# Patient Record
Sex: Female | Born: 1990
Health system: Southern US, Community
[De-identification: ages and names within clinical notes are randomized; demographics above are authoritative.]

## PROBLEM LIST (undated history)

## (undated) ENCOUNTER — Inpatient Hospital Stay (HOSPITAL_COMMUNITY): Payer: Self-pay

## (undated) DIAGNOSIS — Z789 Other specified health status: Secondary | ICD-10-CM

## (undated) DIAGNOSIS — A749 Chlamydial infection, unspecified: Secondary | ICD-10-CM

## (undated) HISTORY — PX: DILATION AND CURETTAGE OF UTERUS: SHX78

## (undated) HISTORY — DX: Other specified health status: Z78.9

---

## 2015-10-18 ENCOUNTER — Ambulatory Visit (INDEPENDENT_AMBULATORY_CARE_PROVIDER_SITE_OTHER): Payer: 59 | Admitting: Family Medicine

## 2015-10-18 ENCOUNTER — Encounter: Payer: Self-pay | Admitting: Family Medicine

## 2015-10-18 VITALS — BP 118/90 | HR 79 | Temp 98.3°F | Ht 65.0 in | Wt 260.2 lb

## 2015-10-18 DIAGNOSIS — L0591 Pilonidal cyst without abscess: Secondary | ICD-10-CM

## 2015-10-18 MED ORDER — DOXYCYCLINE HYCLATE 100 MG PO CAPS: 100.0000 mg | ORAL_CAPSULE | Freq: Two times a day (BID) | ORAL | Status: DC

## 2015-10-18 NOTE — Patient Instructions (Signed)
Do warm, soapy water soaks twice a day for the next new days.  Then pat the area dry and apply a bandage or tuck some gauze as needed Use the doxycycline antibiotic as directed. If you do not get your period as expected this week take a home pregnancy test!   Let me know if the area does not continue to heal

## 2015-10-18 NOTE — Progress Notes (Signed)
Pre visit review using our clinic review tool, if applicable. No additional management support is needed unless otherwise documented below in the visit note. 

## 2015-10-18 NOTE — Progress Notes (Signed)
[facility] [place] [address], [address] [place], [place] [id number] [phone] [phone]  Date:  10/18/2015   Name:  Dawn FoldsCristal Mullins   DOB:  07/29/1990   MRN:  [id number]  PCP:  No primary care provider on file.    Chief Complaint: New Patient (Initial Visit)   History of Present Illness:  Dawn Mullins is a 25 y.o. very pleasant female patient who presents with the following: New patient for acute visit Generally healthy young lady here today with complaint of a painful cyst or abscess in her gluteal cleft that appeared about 4 days ago, became terribly painful and then ruptured on its own this am early.  It drained blood and liquid. It does feel a lot better now- she is able to walk and sit without pain.   She has never had this in the past. She did have a fever the last 2 days- up to 102.3.  She is feeling much better now and her fever is resolved.  No antipyretics today so far No vomiting She is eating pretty well- better today  LMP 3/15.   She is otherwise well today  There are no active problems to display for this patient.   No past medical history on file.  No past surgical history on file.  Social History  Substance Use Topics  . Smoking status: Never Smoker   . Smokeless tobacco: None  . Alcohol Use: None    No family history on file.  No Known Allergies  Medication list has been reviewed and updated.  No current outpatient prescriptions on file prior to visit.   No current facility-administered medications on file prior to visit.    Review of Systems:  As per HPI- otherwise negative.   Physical Examination: Filed Vitals:   10/18/15 1434  BP: 118/90  Pulse: 79  Temp: 98.3 F (36.8 C)   Filed Vitals:   10/18/15 1434  Height: 5\' 5"  (1.651 m)  Weight: 260 lb 3.2 oz (118.026 kg)   Body mass index is 43.3 kg/(m^2). Ideal Body Weight: Weight in (lb) to have BMI = 25: 149.9  GEN: WDWN, NAD, Non-toxic, A  & O x 3, obese, looks well HEENT: Atraumatic, Normocephalic. Neck supple. No masses, No LAD. Ears and Nose: No external deformity. CV: RRR, No M/G/R. No JVD. No thrill. No extra heart sounds. PULM: CTA B, no wheezes, crackles, rhonchi. No retractions. No resp. distress. No accessory muscle use. EXTR: No c/c/e NEURO Normal gait.  PSYCH: Normally interactive. Conversant. Not depressed or anxious appearing.  Calm demeanor.  Gluteal cleft- spontaneously ruptured infected pilonidal cyst is present. I can expressed serosanguinous fluid and the area is inflamed and tender but no pus is present  She is married- LMP less than a month ago. She and her husband use condoms consistently and she does not have any suspicion of pregnancy   Assessment and Plan: Pilonidal cyst - Plan: doxycycline (VIBRAMYCIN) 100 MG capsule  Already ruptured spontaneously. Use hot water soaks, doxycycline.  Cautioned against pregnancy while on this medication She will let me know if not continuing to improve Warned her to seek care right away if she has further infections with fever as this could lead to sepsis.     Signed Abbe AmsterdamJessica Rjay Revolorio, MD

## 2016-08-23 DIAGNOSIS — N926 Irregular menstruation, unspecified: Secondary | ICD-10-CM | POA: Diagnosis not present

## 2016-09-02 ENCOUNTER — Inpatient Hospital Stay (HOSPITAL_COMMUNITY): Payer: 59

## 2016-09-02 ENCOUNTER — Encounter (HOSPITAL_COMMUNITY): Payer: Self-pay

## 2016-09-02 ENCOUNTER — Inpatient Hospital Stay (HOSPITAL_COMMUNITY)
Admission: AD | Admit: 2016-09-02 | Discharge: 2016-09-02 | Disposition: A | Discharge status: Home / Self Care | Payer: 59 | Source: Ambulatory Visit | Attending: Obstetrics | Admitting: Obstetrics

## 2016-09-02 DIAGNOSIS — O209 Hemorrhage in early pregnancy, unspecified: Secondary | ICD-10-CM | POA: Diagnosis not present

## 2016-09-02 DIAGNOSIS — N930 Postcoital and contact bleeding: Secondary | ICD-10-CM | POA: Insufficient documentation

## 2016-09-02 DIAGNOSIS — Z3A08 8 weeks gestation of pregnancy: Secondary | ICD-10-CM | POA: Insufficient documentation

## 2016-09-02 DIAGNOSIS — O208 Other hemorrhage in early pregnancy: Secondary | ICD-10-CM | POA: Diagnosis not present

## 2016-09-02 HISTORY — DX: Chlamydial infection, unspecified: A74.9

## 2016-09-02 LAB — CBC WITH DIFFERENTIAL/PLATELET
BASOS PCT: 0 %
Basophils Absolute: 0 10*3/uL (ref 0.0–0.1)
EOS ABS: 0.1 10*3/uL (ref 0.0–0.7)
EOS PCT: 1 %
HEMATOCRIT: 36.9 % (ref 36.0–46.0)
Hemoglobin: 13 g/dL (ref 12.0–15.0)
Lymphocytes Relative: 20 %
Lymphs Abs: 2.5 10*3/uL (ref 0.7–4.0)
MCH: 28.2 pg (ref 26.0–34.0)
MCHC: 35.2 g/dL (ref 30.0–36.0)
MCV: 80 fL (ref 78.0–100.0)
MONO ABS: 0.5 10*3/uL (ref 0.1–1.0)
MONOS PCT: 4 %
Neutro Abs: 9.6 10*3/uL — ABNORMAL HIGH (ref 1.7–7.7)
Neutrophils Relative %: 75 %
Platelets: 309 10*3/uL (ref 150–400)
RBC: 4.61 MIL/uL (ref 3.87–5.11)
RDW: 13.2 % (ref 11.5–15.5)
WBC: 12.7 10*3/uL — ABNORMAL HIGH (ref 4.0–10.5)

## 2016-09-02 LAB — URINALYSIS, ROUTINE W REFLEX MICROSCOPIC
Bilirubin Urine: NEGATIVE
GLUCOSE, UA: NEGATIVE mg/dL
Ketones, ur: NEGATIVE mg/dL
Leukocytes, UA: NEGATIVE
NITRITE: NEGATIVE
PH: 5 (ref 5.0–8.0)
Protein, ur: NEGATIVE mg/dL
Specific Gravity, Urine: 1.021 (ref 1.005–1.030)

## 2016-09-02 LAB — HCG, QUANTITATIVE, PREGNANCY: HCG, BETA CHAIN, QUANT, S: 57409 m[IU]/mL — AB (ref <5)

## 2016-09-02 LAB — WET PREP, GENITAL
Sperm: NONE SEEN
TRICH WET PREP: NONE SEEN
YEAST WET PREP: NONE SEEN

## 2016-09-02 LAB — ABO/RH: ABO/RH(D): B POS

## 2016-09-02 NOTE — Discharge Instructions (Signed)
Vaginal Bleeding During Pregnancy, First Trimester °A small amount of bleeding (spotting) from the vagina is common in early pregnancy. Sometimes the bleeding is normal and is not a problem, and sometimes it is a sign of something serious. Be sure to tell your doctor about any bleeding from your vagina right away. °Follow these instructions at home: °· Watch your condition for any changes. °· Follow your doctor's instructions about how active you can be. °· If you are on bed rest: °¨ You may need to stay in bed and only get up to use the bathroom. °¨ You may be allowed to do some activities. °¨ If you need help, make plans for someone to help you. °· Write down: °¨ The number of pads you use each day. °¨ How often you change pads. °¨ How soaked (saturated) your pads are. °· Do not use tampons. °· Do not douche. °· Do not have sex or orgasms until your doctor says it is okay. °· If you pass any tissue from your vagina, save the tissue so you can show it to your doctor. °· Only take medicines as told by your doctor. °· Do not take aspirin because it can make you bleed. °· Keep all follow-up visits as told by your doctor. °Contact a doctor if: °· You bleed from your vagina. °· You have cramps. °· You have labor pains. °· You have a fever that does not go away after you take medicine. °Get help right away if: °· You have very bad cramps in your back or belly (abdomen). °· You pass large clots or tissue from your vagina. °· You bleed more. °· You feel light-headed or weak. °· You pass out (faint). °· You have chills. °· You are leaking fluid or have a gush of fluid from your vagina. °· You pass out while pooping (having a bowel movement). °This information is not intended to replace advice given to you by your health care provider. Make sure you discuss any questions you have with your health care provider. °Document Released: 11/10/2013 Document Revised: 12/02/2015 Document Reviewed: 03/03/2013 °Elsevier Interactive  Patient Education © 2017 Elsevier Inc. ° °

## 2016-09-02 NOTE — MAU Provider Note (Signed)
History   G2P0010 @ 8.6 wks in with brownish spotting since yesterday. States started after having sex yesterday. Pt states [facility] and had viable IUP at that time. Denies pain or cramping.  CSN: [id number]  Arrival date & time 09/02/16  1740   None     Chief Complaint  Patient presents with  . Vaginal Bleeding    HPI  Past Medical History:  Diagnosis Date  . Chlamydia     Past Surgical History:  Procedure Laterality Date  . DILATION AND CURETTAGE OF UTERUS      History reviewed. No pertinent family history.  Social History  Substance Use Topics  . Smoking status: Never Smoker  . Smokeless tobacco: Never Used  . Alcohol use No    OB History    Gravida Para Term Preterm AB Living   2       1     SAB TAB Ectopic Multiple Live Births     1            Review of Systems  Constitutional: Negative.   HENT: Negative.   Eyes: Negative.   Respiratory: Negative.   Cardiovascular: Negative.   Gastrointestinal: Negative.   Endocrine: Negative.   Genitourinary: Positive for vaginal bleeding.  Musculoskeletal: Negative.     Allergies  Patient has no known allergies.  Home Medications    BP 144/69   Pulse 79   Temp 98.8 F (37.1 C)   Resp 16   Ht 5\' 5"  (1.651 m)   Wt 258 lb (117 kg)   LMP 07/02/2016   BMI 42.93 kg/m   Physical Exam  Constitutional: She is oriented to person, place, and time. She appears well-developed and well-nourished.  HENT:  Head: Normocephalic.  Eyes: Pupils are equal, round, and reactive to light.  Neck: Normal range of motion.  Cardiovascular: Normal rate, regular rhythm, normal heart sounds and intact distal pulses.   Pulmonary/Chest: Effort normal and breath sounds normal.  Abdominal: Soft. Bowel sounds are normal.  Genitourinary: Vagina normal and uterus normal.  Musculoskeletal: Normal range of motion.  Neurological: She is alert and oriented to person, place, and time. She has normal reflexes.  Skin:  Skin is warm and dry.  Psychiatric: She has a normal mood and affect. Her behavior is normal. Judgment and thought content normal.    MAU Course  Procedures (including critical care time)  Labs Reviewed  WET PREP, GENITAL  URINALYSIS, ROUTINE W REFLEX MICROSCOPIC  CBC WITH DIFFERENTIAL/PLATELET  HCG, QUANTITATIVE, PREGNANCY  ABO/RH  GC/CHLAMYDIA PROBE AMP ([place]) NOT AT [facility]   No results found.   No diagnosis found.    MDM  Post coital bleeding. Sterile spec exam cervix closed only spottingamt brownish old blood in vaginal vault no active bleeding. Wet prep, GC, Chla, ABO rh and quant

## 2016-09-02 NOTE — MAU Note (Signed)
Pt had light bleeding today when she wiped. Not filling a pad. Vomiting no diarrhea. No pain

## 2016-09-04 LAB — GC/CHLAMYDIA PROBE AMP (~~LOC~~) NOT AT ARMC
Chlamydia: NEGATIVE
NEISSERIA GONORRHEA: NEGATIVE

## 2016-09-24 ENCOUNTER — Encounter (HOSPITAL_COMMUNITY): Payer: Self-pay | Admitting: *Deleted

## 2016-09-24 ENCOUNTER — Inpatient Hospital Stay (HOSPITAL_COMMUNITY)
Admission: RE | Admit: 2016-09-24 | Discharge: 2016-09-24 | Disposition: A | Discharge status: Home / Self Care | Payer: 59 | Source: Ambulatory Visit | Attending: Obstetrics and Gynecology | Admitting: Obstetrics and Gynecology

## 2016-09-24 ENCOUNTER — Inpatient Hospital Stay (HOSPITAL_COMMUNITY): Payer: 59

## 2016-09-24 DIAGNOSIS — O98811 Other maternal infectious and parasitic diseases complicating pregnancy, first trimester: Secondary | ICD-10-CM | POA: Insufficient documentation

## 2016-09-24 DIAGNOSIS — O209 Hemorrhage in early pregnancy, unspecified: Secondary | ICD-10-CM | POA: Diagnosis present

## 2016-09-24 DIAGNOSIS — A749 Chlamydial infection, unspecified: Secondary | ICD-10-CM | POA: Insufficient documentation

## 2016-09-24 DIAGNOSIS — O4690 Antepartum hemorrhage, unspecified, unspecified trimester: Secondary | ICD-10-CM

## 2016-09-24 DIAGNOSIS — Z3A08 8 weeks gestation of pregnancy: Secondary | ICD-10-CM | POA: Insufficient documentation

## 2016-09-24 DIAGNOSIS — O039 Complete or unspecified spontaneous abortion without complication: Secondary | ICD-10-CM | POA: Insufficient documentation

## 2016-09-24 DIAGNOSIS — R10814 Left lower quadrant abdominal tenderness: Secondary | ICD-10-CM | POA: Diagnosis not present

## 2016-09-24 NOTE — Discharge Instructions (Signed)
° °  Miscarriage A miscarriage is the loss of an unborn baby (fetus) before the 20th week of pregnancy. The cause is often unknown. Follow these instructions at home:  You may need to stay in bed (bed rest), or you may be able to do light activity. Go about activity as told by your doctor.  Have help at home.  Write down how many pads you use each day. Write down how soaked they are.  Do not use tampons. Do not wash out your vagina (douche) or have sex (intercourse) until your doctor approves.  Only take medicine as told by your doctor.  Do not take aspirin.  Keep all doctor visits as told.  If you or your partner have problems with grieving, talk to your doctor. You can also try counseling. Give yourself time to grieve before trying to get pregnant again. Get help right away if:  You have bad cramps or pain in your back or belly (abdomen).  You have a fever.  You pass large clumps of blood (clots) from your vagina that are walnut-sized or larger. Save the clumps for your doctor to see.  You pass large amounts of tissue from your vagina. Save the tissue for your doctor to see.  You have more bleeding.  You have thick, bad-smelling fluid (discharge) coming from the vagina.  You get lightheaded, weak, or you pass out (faint).  You have chills. This information is not intended to replace advice given to you by your health care provider. Make sure you discuss any questions you have with your health care provider. Document Released: 09/18/2011 Document Revised: 12/02/2015 Document Reviewed: 07/27/2011 Elsevier Interactive Patient Education  2017 Elsevier Inc.   Pelvic Rest Pelvic rest may be recommended if:  Your placenta is partially or completely covering the opening of your cervix (placenta previa).  There is bleeding between the wall of the uterus and the amniotic sac in the first trimester of pregnancy (subchorionic hemorrhage).  You went into labor too early (preterm  labor). Based on your overall health and the health of your baby, your health care provider will decide if pelvic rest is right for you. How do I rest my pelvis? For as long as told by your health care provider:  Do not have sex, sexual stimulation, or an orgasm.  Do not use tampons. Do not douche. Do not put anything in your vagina.  Do not lift anything that is heavier than 10 lb (4.5 kg).  Avoid activities that take a lot of effort (are strenuous).  Avoid any activity in which your pelvic muscles could become strained. When should I seek medical care? Seek medical care if you have:  Cramping pain in your lower abdomen.  Vaginal discharge.  A low, dull backache.  Regular contractions.  Uterine tightening. When should I seek immediate medical care? Seek immediate medical care if:  You have vaginal bleeding and you are pregnant. This information is not intended to replace advice given to you by your health care provider. Make sure you discuss any questions you have with your health care provider. Document Released: 10/21/2010 Document Revised: 12/02/2015 Document Reviewed: 12/28/2014 Elsevier Interactive Patient Education  2017 Elsevier Inc. CALL  IF TEMP>100.4, NOTHING PER VAGINA X 2 WK, CALL IF SOAKING A MAXI  PAD EVERY HOUR OR MORE FREQUENTLY

## 2016-09-24 NOTE — MAU Note (Addendum)
[facility]  [place]. Pt has had US in office and here, confirmed IUP. Mod amt of blood noted. Pt denies pain.  Needed to use restroom on arrival. Pt denies dizziness.  IV of NS infusing, 400 to count.

## 2016-09-24 NOTE — MAU Provider Note (Signed)
History     Chief Complaint  Patient presents with  . bleeding in preg    26 yo G2P0010 Hispanic female presents with c/o vaginal bleeding and passage of clots. LMP 2/27 hx irreg cycles.  Pt is currently [redacted] weeks pregnant and had sonogram in the office which had shown SIUP  With (+) FHR with date discrepancy  OB History    Gravida Para Term Preterm AB Living   2       1     SAB TAB Ectopic Multiple Live Births     1            Past Medical History:  Diagnosis Date  . Chlamydia     Past Surgical History:  Procedure Laterality Date  . DILATION AND CURETTAGE OF UTERUS      History reviewed. No pertinent family history.  Social History  Substance Use Topics  . Smoking status: Never Smoker  . Smokeless tobacco: Never Used  . Alcohol use No    Allergies: No Known Allergies  No prescriptions prior to admission.     Physical Exam   Blood pressure 123/66, pulse 76, temperature 98.4 F (36.9 C), temperature source Oral, resp. rate 16, last menstrual period 07/02/2016, SpO2 100 %.  General appearance: alert, cooperative and no distress Lungs: clear to auscultation bilaterally Heart: regular rate and rhythm Abdomen: soft, non-tender; bowel sounds normal; no masses,  no organomegaly Pelvic: cervix normal in appearance, external genitalia normal, no adnexal masses or tenderness, uterus normal size, shape, and consistency and closed cervix, uterus nl, blood in vagina Extremities: no edema, redness or tenderness in the calves or thighs ED Course  IMP: 1st trimester vaginal bleeding P) sonogram MDM  [place] Ob Transvaginal  Result Date: 09/24/2016 CLINICAL DATA:  Vaginal bleeding. Estimated gestational age [redacted] weeks 6 days by previous ultrasound. EXAM: TRANSVAGINAL OB ULTRASOUND TECHNIQUE: Transvaginal ultrasound was performed for complete evaluation of the gestation as well as the maternal uterus, adnexal regions, and pelvic cul-de-sac. COMPARISON:  09/02/2016 FINDINGS:  Intrauterine gestational sac: Single visualized. Gestational sac is somewhat elongated and irregular compared to the previous exam. Location of sac in lower uterine segment. Yolk sac:  Visualized. Embryo:  Visualized. Cardiac Activity: Not visualized. Heart Rate: Absent. CRL:   4.6  mm   6 w 1 d                  US EDC: 05/17/2017 Subchorionic hemorrhage:  None visualized. Maternal uterus/adnexae: Ovaries not visualized.  No free fluid. IMPRESSION: When compared with the previous exam, findings are compatible with failed pregnancy. Electronically Signed   By: Elberta Fortisaniel  Boyle M.D.   On: 09/24/2016 16:03  IMP: MAB/inevitable SAB Pt declines cytotec p)d/c home. Heartstring info given. SAB precauations given. Rh positive. f/u with Dr Coralyn Pearaavon Minette Manders A, MD 3:30 PM 09/24/2016

## 2016-09-24 NOTE — MAU Note (Signed)
Patient states on Friday she had light pink vaginal bleeding that she noticed when she wiped; then today progressed to period like vaginal bleeding with clots. Denies any pain at this time.

## 2016-11-21 LAB — OB RESULTS CONSOLE GBS: STREP GROUP B AG: NEGATIVE

## 2017-01-30 ENCOUNTER — Encounter: Payer: Self-pay | Admitting: Medical

## 2017-01-30 ENCOUNTER — Ambulatory Visit (INDEPENDENT_AMBULATORY_CARE_PROVIDER_SITE_OTHER): Payer: 59 | Admitting: Medical

## 2017-01-30 DIAGNOSIS — J029 Acute pharyngitis, unspecified: Secondary | ICD-10-CM | POA: Diagnosis not present

## 2017-01-30 DIAGNOSIS — J020 Streptococcal pharyngitis: Secondary | ICD-10-CM | POA: Diagnosis not present

## 2017-01-30 LAB — POCT RAPID STREP A (OFFICE): Rapid Strep A Screen: POSITIVE — AB

## 2017-01-30 MED ORDER — AMOXICILLIN 875 MG PO TABS
875.0000 mg | ORAL_TABLET | Freq: Two times a day (BID) | ORAL | 0 refills | Status: DC
Start: 2017-01-30 — End: 2017-12-28

## 2017-01-30 MED ORDER — TRAMADOL HCL 50 MG PO TABS: 50.0000 mg | ORAL_TABLET | Freq: Four times a day (QID) | ORAL | 0 refills | Status: DC | PRN

## 2017-01-30 MED FILL — traMADol HCL 50 MG TABS: 50 | 1 days supply | Qty: 3 | Fill #0

## 2017-01-30 MED FILL — AMOXICILLIN 875 MG TABLET: 875 | 10 days supply | Qty: 20 | Fill #0

## 2017-01-30 NOTE — Progress Notes (Signed)
Subjective:    Patient ID: Dawn Mullins, female    DOB: [birth year], 26 y.o.   MRN: [id number]  HPI  Pt in with st for 2 days. Pt states severe st. Hurts to swallow own saliva. Hard to sleep due to pain. Pt has 38 yo nephew. Pt nephew was sick with staph infection. Also he was sick with severe runny nose/colored mucous. She has been watching him recently.  Pt states some mild body aches. No Ha. No rashes.  Some sweaty but no fever or chills.  LMP- started yesterday.     Review of Systems  Constitutional: Positive for diaphoresis. Negative for chills and fever.  HENT: Positive for sore throat. Negative for congestion, drooling and ear pain.   Respiratory: Negative for cough, chest tightness, shortness of breath and wheezing.   Cardiovascular: Negative for chest pain and palpitations.  Gastrointestinal: Negative for abdominal pain.  Musculoskeletal: Positive for myalgias. Negative for back pain and gait problem.       Mild  Skin: Negative for rash.  Neurological: Negative for dizziness, speech difficulty, weakness, numbness and headaches.  Hematological: Negative for adenopathy. Does not bruise/bleed easily.  Psychiatric/Behavioral: Negative for behavioral problems and confusion.    Past Medical History:  Diagnosis Date  . Chlamydia      Social History   Social History  . Marital status: Single    Spouse name: N/A  . Number of children: N/A  . Years of education: N/A   Occupational History  . Not on file.   Social History Main Topics  . Smoking status: Never Smoker  . Smokeless tobacco: Never Used  . Alcohol use No  . Drug use: No  . Sexual activity: Yes   Other Topics Concern  . Not on file   Social History Narrative  . No narrative on file    Past Surgical History:  Procedure Laterality Date  . DILATION AND CURETTAGE OF UTERUS      No family history on file.  No Known Allergies  No current outpatient prescriptions on file prior to visit.    No current facility-administered medications on file prior to visit.     LMP 07/02/2016       Objective:   Physical Exam   General  Mental Status - Alert. General Appearance - Well groomed. Not in acute distress.  Skin Rashes- No Rashes.  HEENT Head- Normal. Ear Auditory Canal - Left- Normal. Right - Normal.Tympanic Membrane- Left- Normal. Right- Normal. Eye Sclera/Conjunctiva- Left- Normal. Right- Normal. Nose & Sinuses Nasal Mucosa- Left-  Boggy and Congested. Right-  Boggy and  Congested.Bilateral no maxillary and  No frontal sinus pressure. Mouth & Throat Lips: Upper Lip- Normal: no dryness, cracking, pallor, cyanosis, or vesicular eruption. Lower Lip-Normal: no dryness, cracking, pallor, cyanosis or vesicular eruption. Buccal Mucosa- Bilateral- No Aphthous ulcers. Oropharynx- No Discharge or Erythema. Tonsils: Characteristics- Bilateral-moderate  Erythema or Congestion. Size/Enlargement- Bilateral- 1+ enlargement. Discharge- bilateral-None.  Neck Neck- Supple. No Masses. Mild enlarged submandibular lymph nodes.   Chest and Lung Exam Auscultation: Breath Sounds:-Clear even and unlabored.  Cardiovascular Auscultation:Rythm- Regular, rate and rhythm. Murmurs & Other Heart Sounds:Ausculatation of the heart reveal- No Murmurs.  Lymphatic Head & Neck General Head & Neck Lymphatics: Bilateral: Description- Mild enlarged submandibular lymph nodes.      Assessment & Plan:  Your strep test was positive. I am prescribing amoxicillin antibiotic. Rest hydrate, tylenol for fever and warm salt water gargles.   For severe pain will give 3 tabs  of tramadol. Within 24 hours expect throat pain to be much less.  Follow up in 7 days or as needed. Unable to sleep due to severe pain so decided to rx very few tramadol.

## 2017-01-30 NOTE — Patient Instructions (Signed)
Your strep test was positive. I am prescribing amoxicillin antibiotic. Rest hydrate, tylenol for fever and warm salt water gargles.   For severe pain will give 3 tabs of tramadol. Within 24 hours expect throat pain to be much less.  Follow up in 7 days or as needed.

## 2017-05-15 LAB — OB RESULTS CONSOLE ANTIBODY SCREEN: ANTIBODY SCREEN: NEGATIVE

## 2017-05-15 LAB — OB RESULTS CONSOLE RUBELLA ANTIBODY, IGM: Rubella: IMMUNE

## 2017-05-15 LAB — OB RESULTS CONSOLE ABO/RH: RH Type: POSITIVE

## 2017-05-15 LAB — OB RESULTS CONSOLE HIV ANTIBODY (ROUTINE TESTING): HIV: NONREACTIVE

## 2017-05-15 LAB — OB RESULTS CONSOLE RPR: RPR: NONREACTIVE

## 2017-05-15 LAB — OB RESULTS CONSOLE GC/CHLAMYDIA
CHLAMYDIA, DNA PROBE: NEGATIVE
GC PROBE AMP, GENITAL: NEGATIVE

## 2017-05-15 LAB — OB RESULTS CONSOLE HEPATITIS B SURFACE ANTIGEN: HEP B S AG: NEGATIVE

## 2017-06-14 IMAGING — US US OB COMP LESS 14 WK
1 series · 15 of 28 positions shown · non-contrast
Comparison: None.

CLINICAL DATA: Bleeding and vomiting

EXAM:
OBSTETRIC <14 WK US AND TRANSVAGINAL OB US
TECHNIQUE: Both transabdominal and transvaginal ultrasound examinations were
performed for complete evaluation of the gestation as well as the
maternal uterus, adnexal regions, and pelvic cul-de-sac.
Transvaginal technique was performed to assess early pregnancy.

[Series 1: us ob comp less 14 wk · 33 acquisitions, 15 frames shown]
[im 1/33]
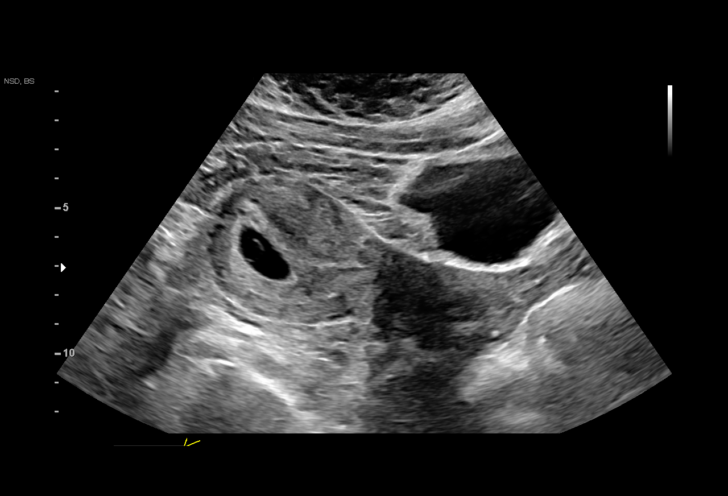
[im 3/33]
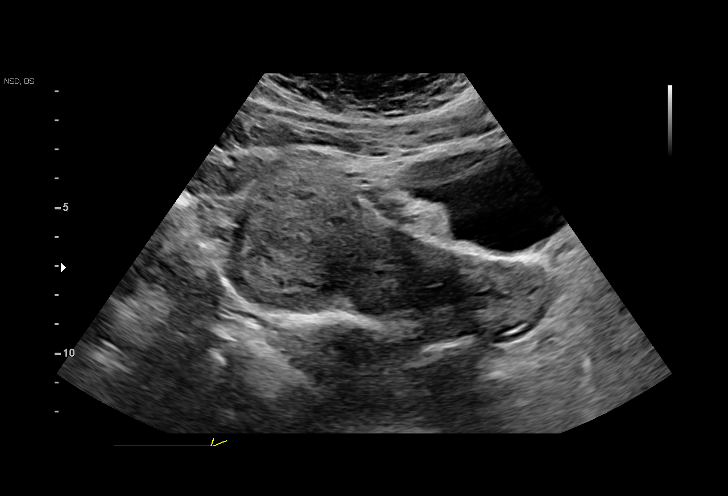
[im 5/33]
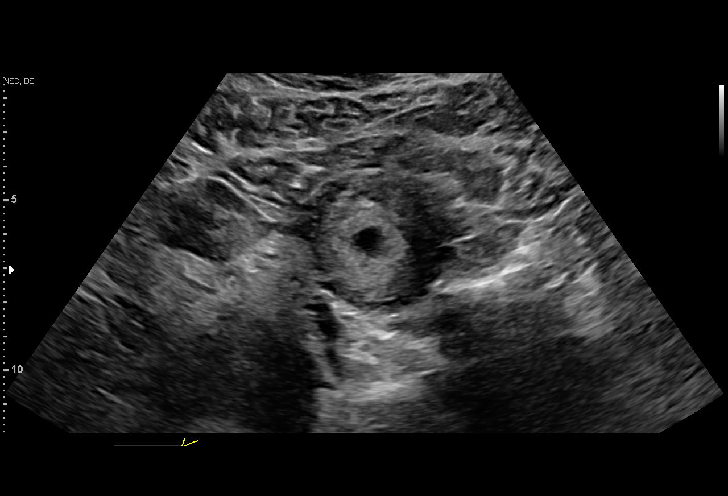
[im 8/33]
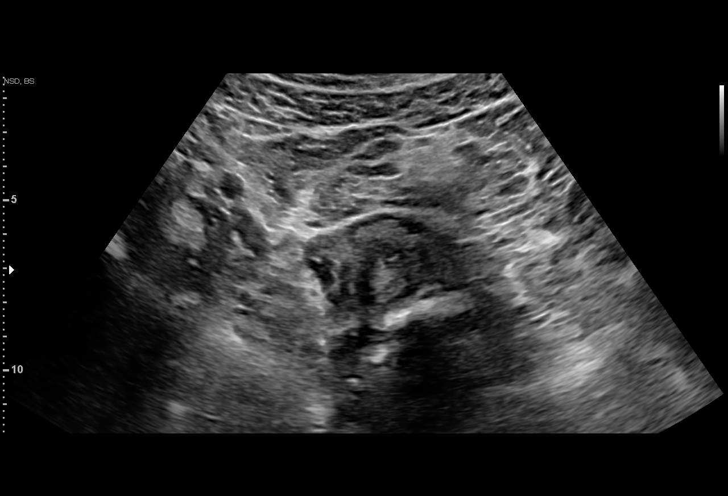
[im 10/33]
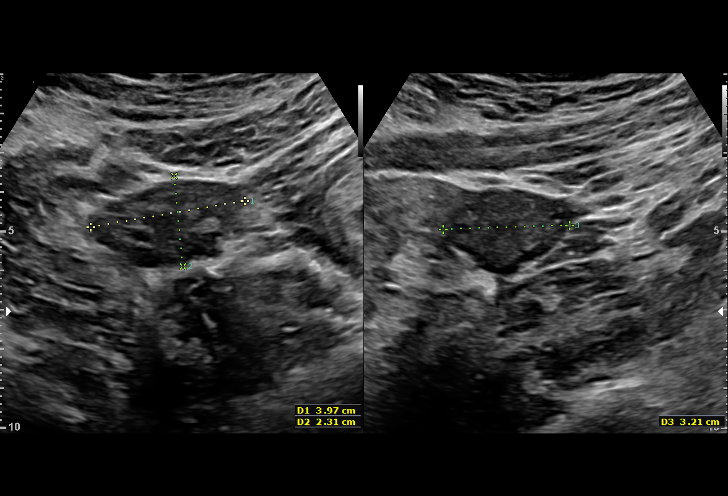
[im 12/33]
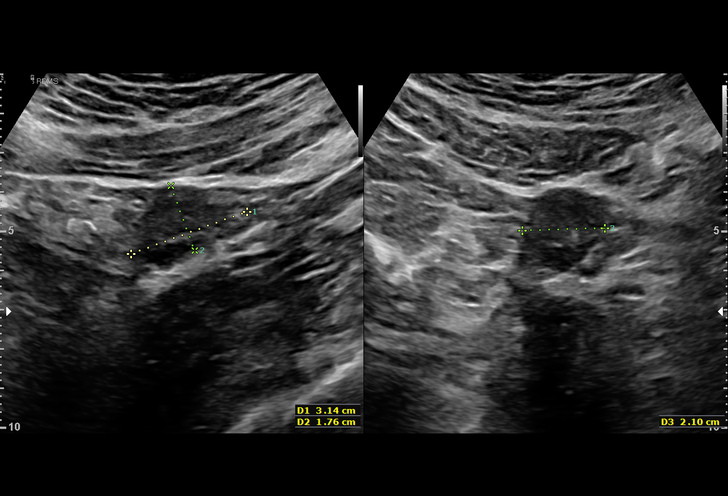
[im 15/33]
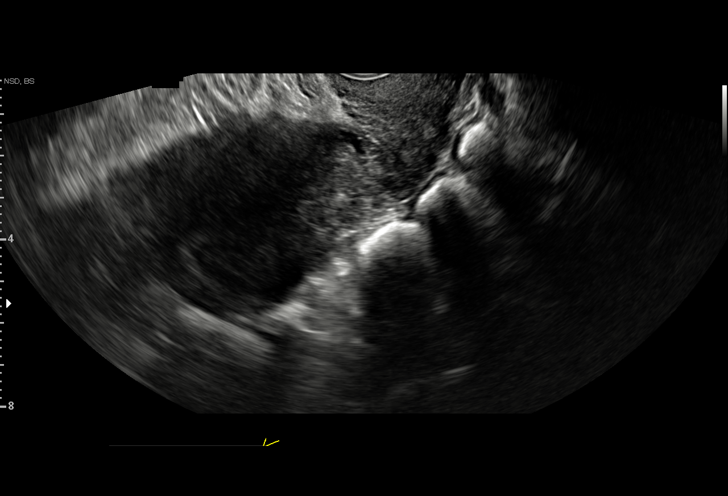
[im 17/33]
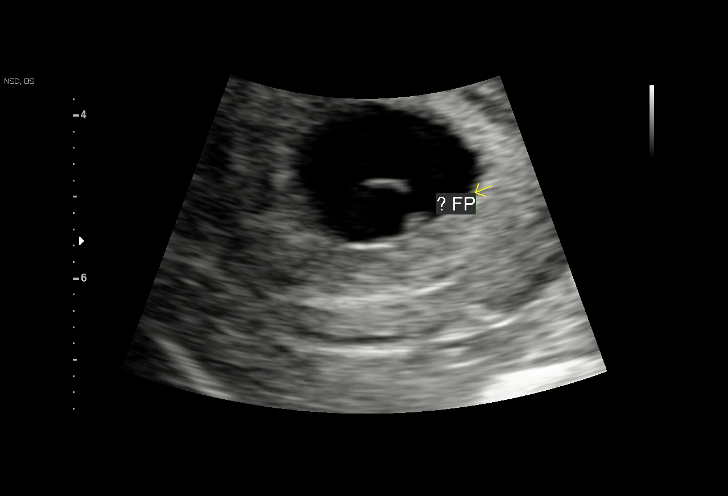
[im 18/33]
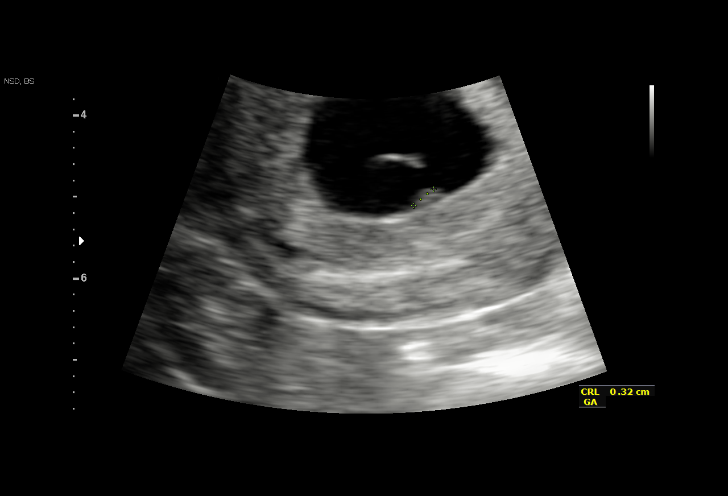
[im 21/33]
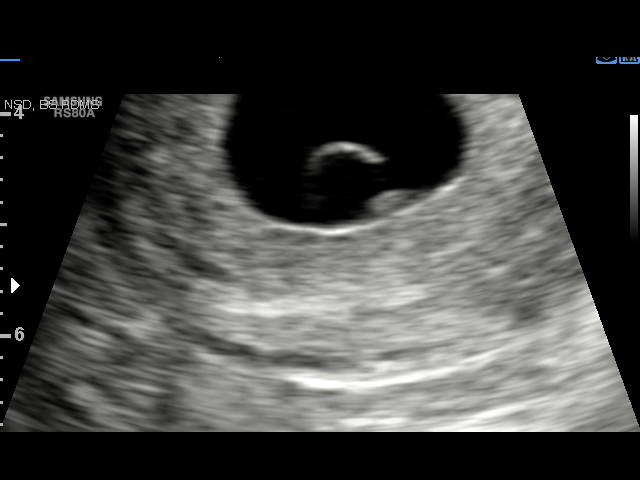
[im 23/33]
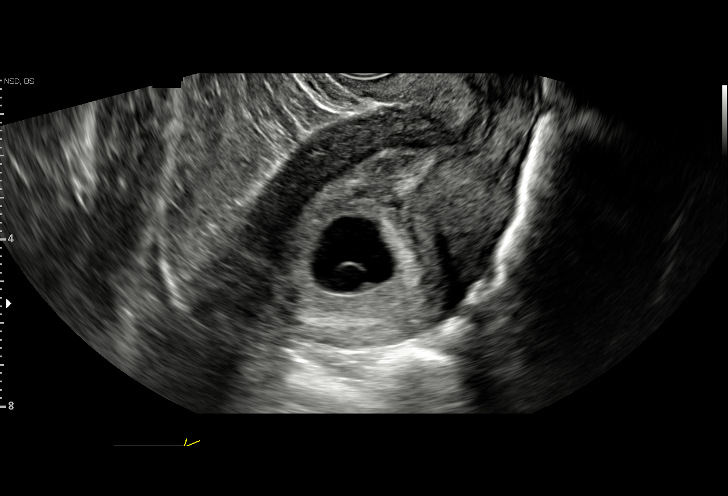
[im 25/33]
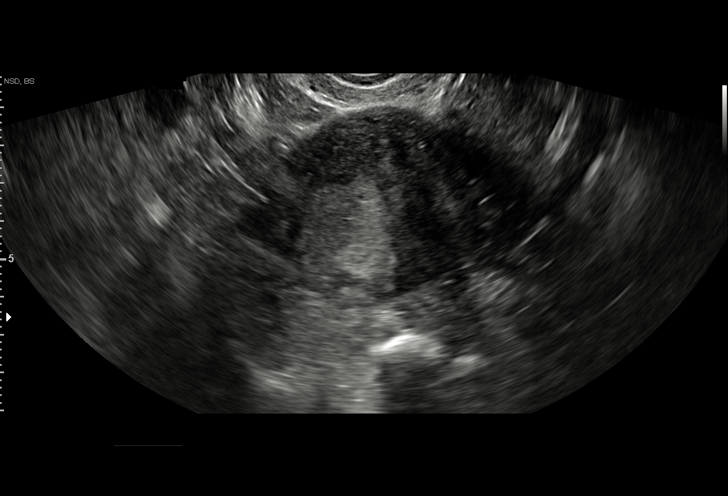
[im 28/33]
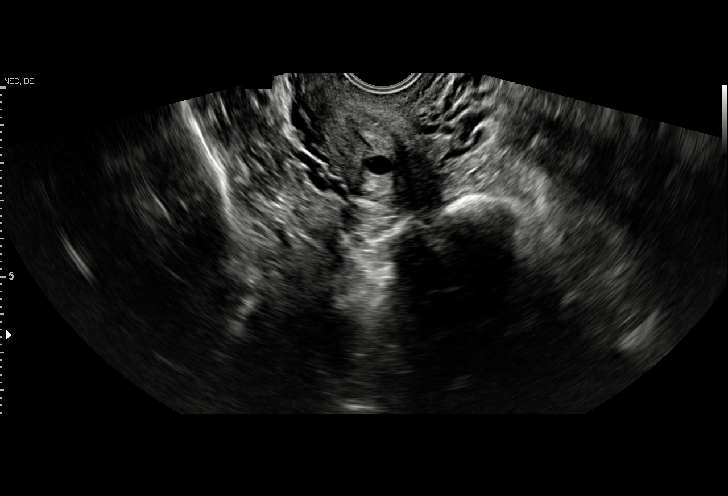
[im 30/33]
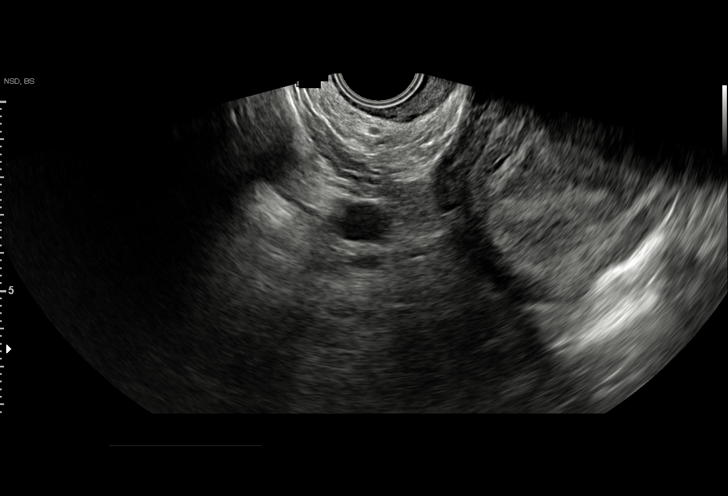
[im 33/33]
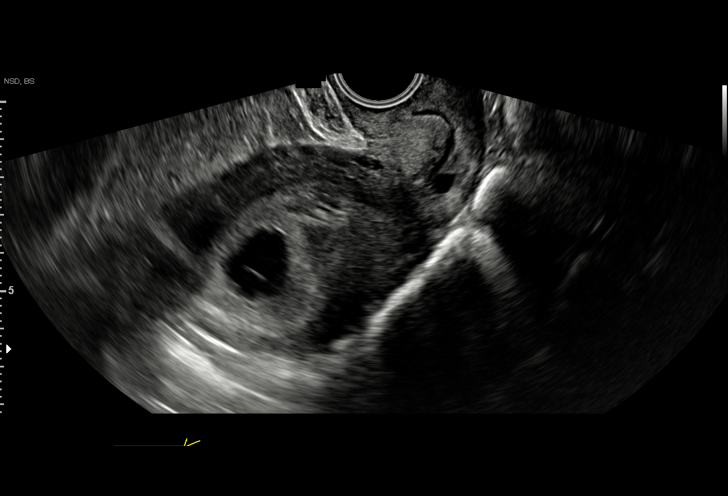

[15 of 28 positions shown; findings below may reference images not displayed]

FINDINGS: Intrauterine gestational sac: Single intrauterine gestational sac.

Yolk sac:  Visualized

Embryo:  Visualized

Cardiac Activity: Visualized

Heart Rate: 95  bpm

CRL:  2.8  mm   5 w   5 d                  US EDC: 04/30/2017

Subchorionic hemorrhage:  None visualized.

Maternal uterus/adnexae: Bilateral ovaries are within normal limits.
The left ovary measures 3.1 x 1.8 x 2.1 cm. The right ovary measures
4 x 2.3 x 3.2 cm. No free fluid.
IMPRESSION: Single intrauterine gestation with tiny fetal pole as above. Fetal
cardiac activity of 95 beats per minute.

## 2017-07-06 IMAGING — US US OB TRANSVAGINAL
1 series · 15 of 26 positions shown · non-contrast
Comparison: 09/02/2016

CLINICAL DATA: Vaginal bleeding. Estimated gestational age 8 weeks
6 days by previous ultrasound.

EXAM:
TRANSVAGINAL OB ULTRASOUND
TECHNIQUE: Transvaginal ultrasound was performed for complete evaluation of the
gestation as well as the maternal uterus, adnexal regions, and
pelvic cul-de-sac.

[Series 1: us ob transvaginal · 26 acquisitions, 15 frames shown]
[im 1/26]
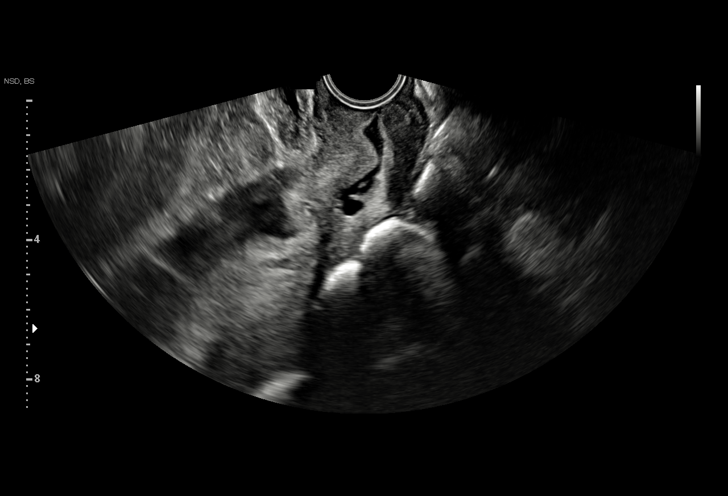
[im 3/26]
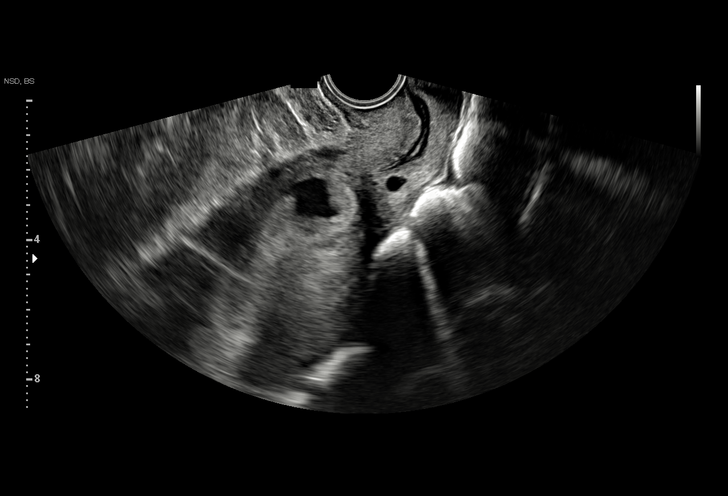
[im 5/26]
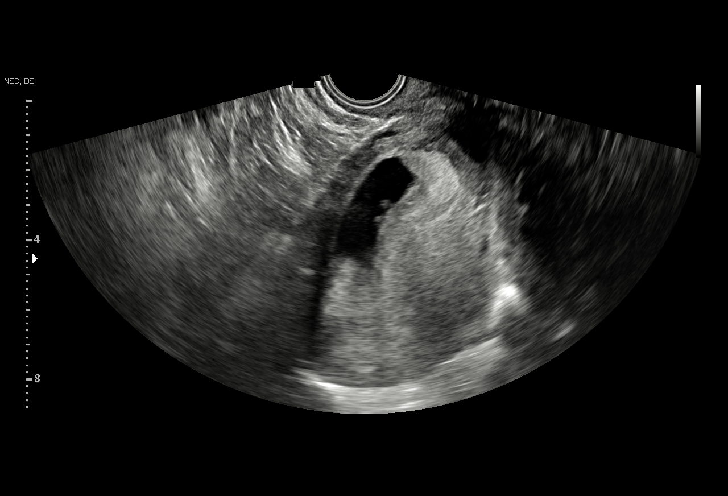
[im 7/26]
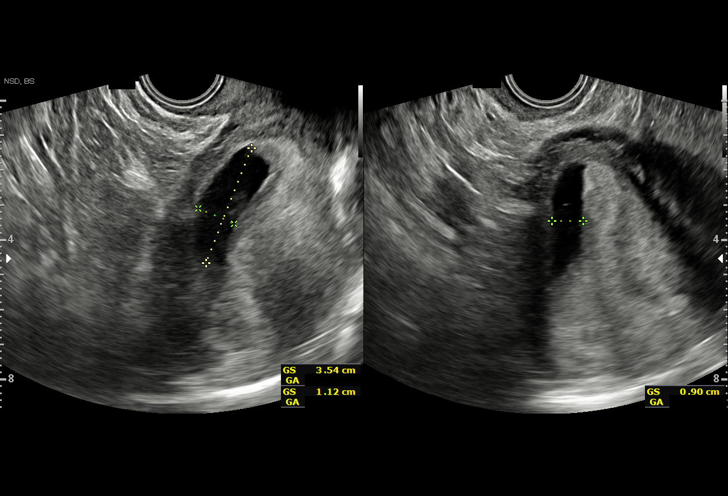
[im 8/26]
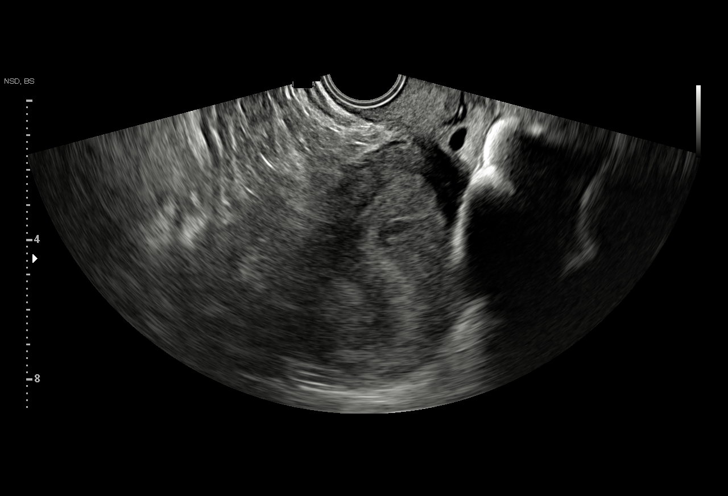
[im 10/26]
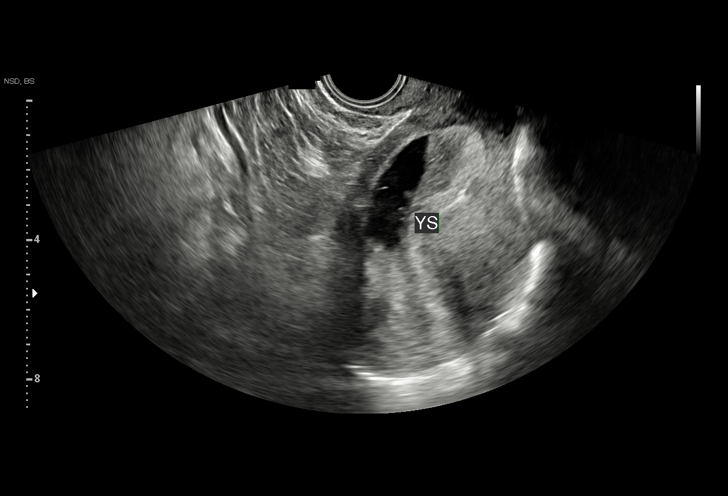
[im 12/26]
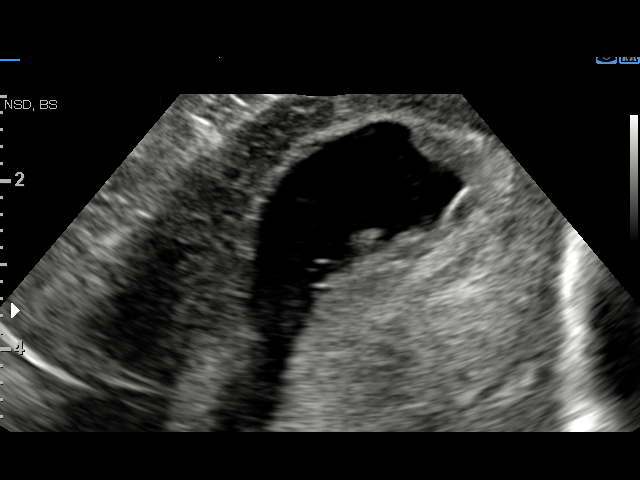
[im 14/26]
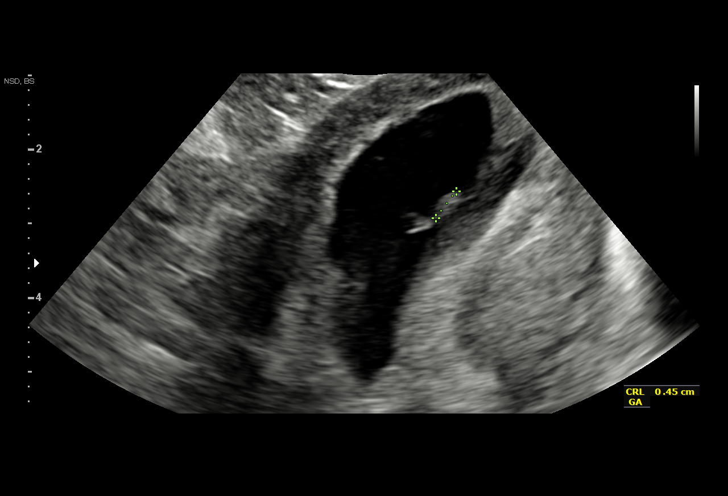
[im 15/26]
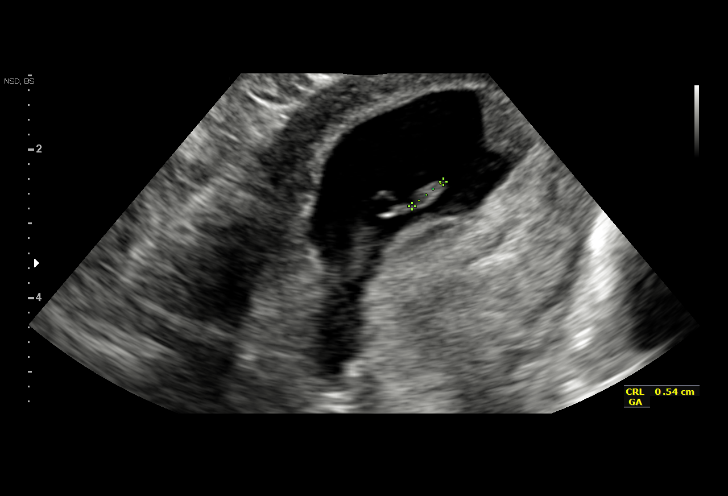
[im 17/26]
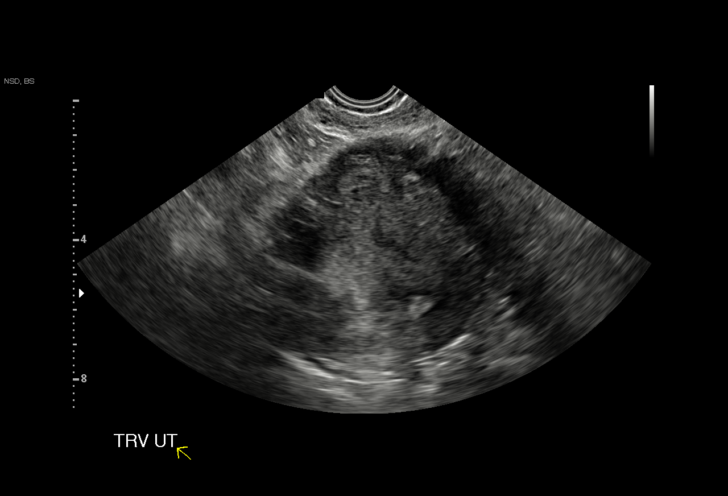
[im 19/26]
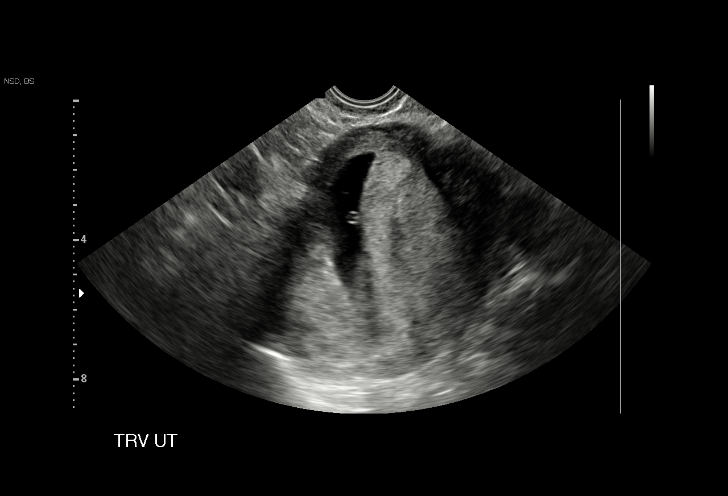
[im 20/26]
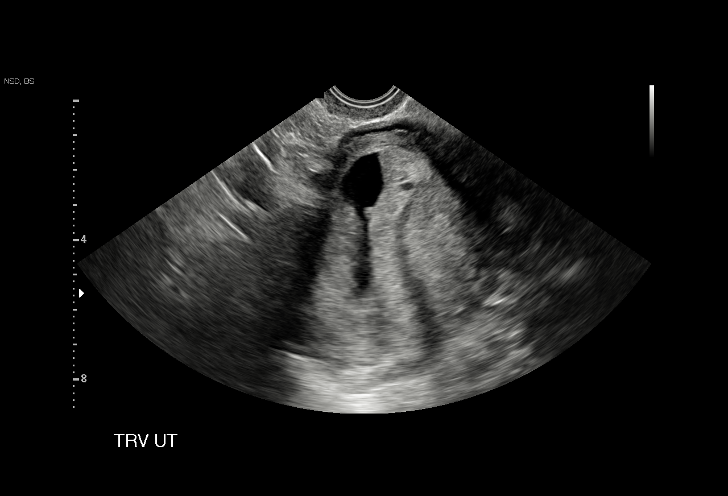
[im 22/26]
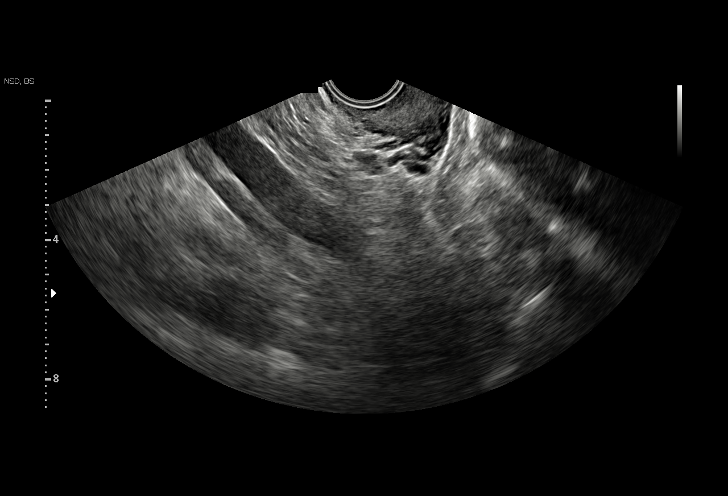
[im 24/26]
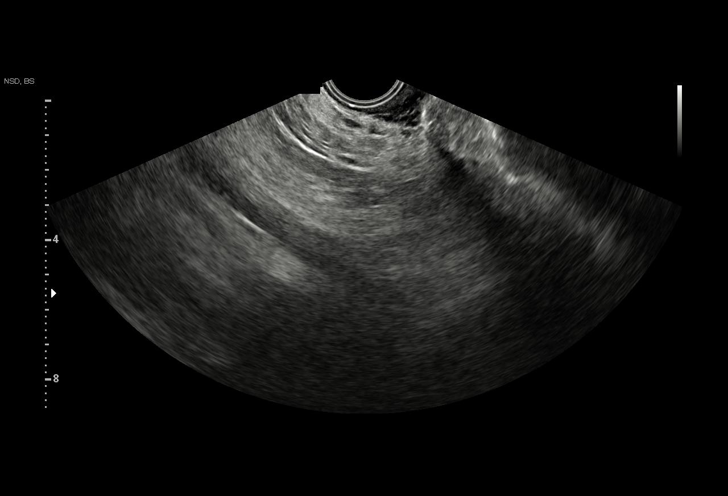
[im 26/26]
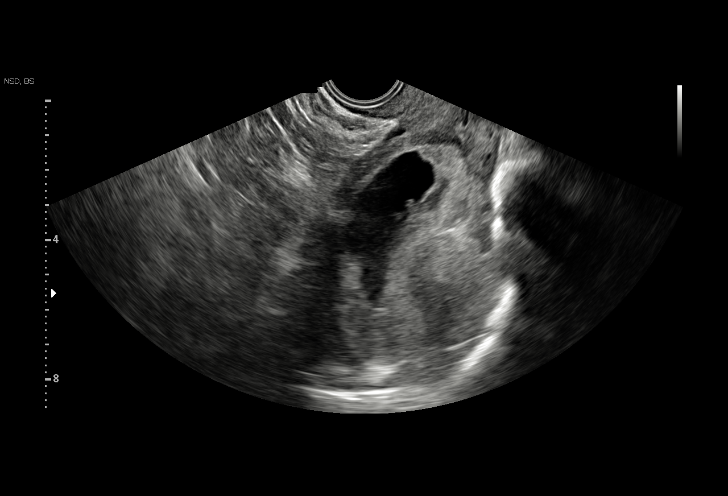

[15 of 26 positions shown; findings below may reference images not displayed]

FINDINGS: Intrauterine gestational sac: Single visualized. Gestational sac is
somewhat elongated and irregular compared to the previous exam.
Location of sac in lower uterine segment.

Yolk sac:  Visualized.

Embryo:  Visualized.

Cardiac Activity: Not visualized.

Heart Rate: Absent.

CRL:   4.6  mm   6 w 1 d                  US EDC: 05/17/2017

Subchorionic hemorrhage:  None visualized.

Maternal uterus/adnexae: Ovaries not visualized.  No free fluid.
IMPRESSION: When compared with the previous exam, findings are compatible with
failed pregnancy.

## 2017-07-10 NOTE — L&D Delivery Note (Signed)
Delivery Note At 10:09 PM a viable and healthy female was delivered via Vaginal, Spontaneous (Presentation: LoA ).  APGAR: 8, 9; weight pending .   Placenta status: spontaneous, intact.  Cord:  with the following complications: none.  Cord pH: na  Anesthesia:  epidural Episiotomy:  na Lacerations:  Second, vaginal Suture Repair: 2.0 vicryl rapide Est. Blood Loss (mL):  200  Mom to postpartum.  Baby to Couplet care / Skin to Skin.  Vale Mousseau J 12/28/2017, 10:23 PM

## 2017-07-30 DIAGNOSIS — O358XX0 Maternal care for other (suspected) fetal abnormality and damage, not applicable or unspecified: Secondary | ICD-10-CM | POA: Diagnosis not present

## 2017-07-30 DIAGNOSIS — Z3A19 19 weeks gestation of pregnancy: Secondary | ICD-10-CM | POA: Diagnosis not present

## 2017-10-09 DIAGNOSIS — O09293 Supervision of pregnancy with other poor reproductive or obstetric history, third trimester: Secondary | ICD-10-CM | POA: Diagnosis not present

## 2017-10-09 DIAGNOSIS — Z3A29 29 weeks gestation of pregnancy: Secondary | ICD-10-CM | POA: Diagnosis not present

## 2017-11-21 DIAGNOSIS — O09293 Supervision of pregnancy with other poor reproductive or obstetric history, third trimester: Secondary | ICD-10-CM | POA: Diagnosis not present

## 2017-11-21 DIAGNOSIS — Z3A35 35 weeks gestation of pregnancy: Secondary | ICD-10-CM | POA: Diagnosis not present

## 2017-11-27 ENCOUNTER — Ambulatory Visit (INDEPENDENT_AMBULATORY_CARE_PROVIDER_SITE_OTHER): Payer: Self-pay | Admitting: Pediatrics

## 2017-11-27 DIAGNOSIS — Z7681 Expectant parent(s) prebirth pediatrician visit: Secondary | ICD-10-CM

## 2017-11-27 NOTE — Progress Notes (Signed)
Prenatal counseling for impending newborn done-- 1st child-girl, currently 36wks, no complications, prenatal at 87-5wks. Z76.81

## 2017-12-18 ENCOUNTER — Other Ambulatory Visit: Payer: Self-pay | Admitting: Obstetrics and Gynecology

## 2017-12-18 ENCOUNTER — Encounter (HOSPITAL_COMMUNITY): Payer: Self-pay | Admitting: *Deleted

## 2017-12-18 ENCOUNTER — Telehealth (HOSPITAL_COMMUNITY): Payer: Self-pay | Admitting: *Deleted

## 2017-12-18 LAB — OB RESULTS CONSOLE GBS: STREP GROUP B AG: NEGATIVE

## 2017-12-18 NOTE — Telephone Encounter (Signed)
Preadmission screen  

## 2017-12-24 ENCOUNTER — Inpatient Hospital Stay (HOSPITAL_COMMUNITY): Admission: AD | Admit: 2017-12-24 | Payer: 59 | Source: Ambulatory Visit | Admitting: Obstetrics and Gynecology

## 2017-12-25 DIAGNOSIS — Z3A40 40 weeks gestation of pregnancy: Secondary | ICD-10-CM | POA: Diagnosis not present

## 2017-12-25 DIAGNOSIS — O3663X0 Maternal care for excessive fetal growth, third trimester, not applicable or unspecified: Secondary | ICD-10-CM | POA: Diagnosis not present

## 2017-12-28 ENCOUNTER — Inpatient Hospital Stay (HOSPITAL_COMMUNITY): Payer: 59 | Admitting: Anesthesiology

## 2017-12-28 ENCOUNTER — Encounter (HOSPITAL_COMMUNITY): Payer: Self-pay

## 2017-12-28 ENCOUNTER — Inpatient Hospital Stay (HOSPITAL_COMMUNITY)
Admission: RE | Admit: 2017-12-28 | Discharge: 2017-12-30 | DRG: 807 | Disposition: A | Discharge status: Home / Self Care | Payer: 59 | Attending: Obstetrics and Gynecology | Admitting: Obstetrics and Gynecology

## 2017-12-28 ENCOUNTER — Other Ambulatory Visit: Payer: Self-pay

## 2017-12-28 DIAGNOSIS — Z3490 Encounter for supervision of normal pregnancy, unspecified, unspecified trimester: Secondary | ICD-10-CM | POA: Diagnosis present

## 2017-12-28 DIAGNOSIS — Z3A40 40 weeks gestation of pregnancy: Secondary | ICD-10-CM | POA: Diagnosis not present

## 2017-12-28 DIAGNOSIS — O480 Post-term pregnancy: Secondary | ICD-10-CM | POA: Diagnosis not present

## 2017-12-28 LAB — CBC
HCT: 33.9 % — ABNORMAL LOW (ref 36.0–46.0)
Hemoglobin: 11.7 g/dL — ABNORMAL LOW (ref 12.0–15.0)
MCH: 28.5 pg (ref 26.0–34.0)
MCHC: 34.5 g/dL (ref 30.0–36.0)
MCV: 82.5 fL (ref 78.0–100.0)
PLATELETS: 269 10*3/uL (ref 150–400)
RBC: 4.11 MIL/uL (ref 3.87–5.11)
RDW: 13.5 % (ref 11.5–15.5)
WBC: 14.3 10*3/uL — AB (ref 4.0–10.5)

## 2017-12-28 LAB — RPR: RPR: NONREACTIVE

## 2017-12-28 LAB — TYPE AND SCREEN
ABO/RH(D): B POS
Antibody Screen: NEGATIVE

## 2017-12-28 MED ORDER — MISOPROSTOL 25 MCG QUARTER TABLET
25.0000 ug | ORAL_TABLET | ORAL | Status: DC | PRN
  Administered 2017-12-28 (×2): 25 ug via VAGINAL
  Filled 2017-12-28 (×3): qty 1

## 2017-12-28 MED ORDER — LACTATED RINGERS IV SOLN: 500.0000 mL | Freq: Once | INTRAVENOUS | Status: DC

## 2017-12-28 MED ORDER — PHENYLEPHRINE 40 MCG/ML (10ML) SYRINGE FOR IV PUSH (FOR BLOOD PRESSURE SUPPORT)
PREFILLED_SYRINGE | INTRAVENOUS | Status: AC
  Filled 2017-12-28: qty 10

## 2017-12-28 MED ORDER — LACTATED RINGERS IV SOLN
500.0000 mL | INTRAVENOUS | Status: DC | PRN
  Administered 2017-12-28: 500 mL via INTRAVENOUS

## 2017-12-28 MED ORDER — EPHEDRINE 5 MG/ML INJ
10.0000 mg | INTRAVENOUS | Status: DC | PRN
  Filled 2017-12-28: qty 2

## 2017-12-28 MED ORDER — SOD CITRATE-CITRIC ACID 500-334 MG/5ML PO SOLN
30.0000 mL | ORAL | Status: DC | PRN
Start: 2017-12-28 — End: 2017-12-29

## 2017-12-28 MED ORDER — LACTATED RINGERS IV SOLN
INTRAVENOUS | Status: DC
  Administered 2017-12-28 (×2): via INTRAVENOUS

## 2017-12-28 MED ORDER — FENTANYL 2.5 MCG/ML BUPIVACAINE 1/10 % EPIDURAL INFUSION (WH - ANES): 14.0000 mL/h | INTRAMUSCULAR | Status: DC | PRN

## 2017-12-28 MED ORDER — OXYTOCIN 40 UNITS IN LACTATED RINGERS INFUSION - SIMPLE MED
1.0000 m[IU]/min | INTRAVENOUS | Status: DC
  Administered 2017-12-28: 2 m[IU]/min via INTRAVENOUS
  Filled 2017-12-28: qty 1000

## 2017-12-28 MED ORDER — TERBUTALINE SULFATE 1 MG/ML IJ SOLN
0.2500 mg | Freq: Once | INTRAMUSCULAR | Status: DC | PRN
  Filled 2017-12-28: qty 1

## 2017-12-28 MED ORDER — ACETAMINOPHEN 325 MG PO TABS: 650.0000 mg | ORAL_TABLET | ORAL | Status: DC | PRN

## 2017-12-28 MED ORDER — ONDANSETRON HCL 4 MG/2ML IJ SOLN
4.0000 mg | Freq: Four times a day (QID) | INTRAMUSCULAR | Status: DC | PRN
  Administered 2017-12-28: 4 mg via INTRAVENOUS
  Filled 2017-12-28: qty 2

## 2017-12-28 MED ORDER — LIDOCAINE HCL (PF) 1 % IJ SOLN
INTRAMUSCULAR | Status: DC | PRN
  Administered 2017-12-28: 4 mL via EPIDURAL
  Administered 2017-12-28: 5 mL via EPIDURAL

## 2017-12-28 MED ORDER — PHENYLEPHRINE 40 MCG/ML (10ML) SYRINGE FOR IV PUSH (FOR BLOOD PRESSURE SUPPORT)
80.0000 ug | PREFILLED_SYRINGE | INTRAVENOUS | Status: DC | PRN
  Filled 2017-12-28: qty 5

## 2017-12-28 MED ORDER — FENTANYL 2.5 MCG/ML BUPIVACAINE 1/10 % EPIDURAL INFUSION (WH - ANES)
14.0000 mL/h | INTRAMUSCULAR | Status: DC | PRN
  Administered 2017-12-28 (×2): 14 mL/h via EPIDURAL
  Filled 2017-12-28: qty 100

## 2017-12-28 MED ORDER — DIPHENHYDRAMINE HCL 50 MG/ML IJ SOLN: 12.5000 mg | INTRAMUSCULAR | Status: DC | PRN

## 2017-12-28 MED ORDER — FENTANYL 2.5 MCG/ML BUPIVACAINE 1/10 % EPIDURAL INFUSION (WH - ANES)
INTRAMUSCULAR | Status: AC
  Filled 2017-12-28: qty 100

## 2017-12-28 NOTE — Anesthesia Preprocedure Evaluation (Signed)
Anesthesia Evaluation  Patient identified by MRN, date of birth, ID band Patient awake    Reviewed: Allergy & Precautions, NPO status , Patient's Chart, lab work & pertinent test results  Airway Mallampati: II  TM Distance: >3 FB Neck ROM: Full    Dental no notable dental hx.    Pulmonary neg pulmonary ROS,    Pulmonary exam normal breath sounds clear to auscultation       Cardiovascular negative cardio ROS Normal cardiovascular exam Rhythm:Regular Rate:Normal     Neuro/Psych negative neurological ROS  negative psych ROS   GI/Hepatic negative GI ROS, Neg liver ROS,   Endo/Other  negative endocrine ROS  Renal/GU negative Renal ROS     Musculoskeletal negative musculoskeletal ROS (+)   Abdominal (+) + obese,   Peds  Hematology negative hematology ROS (+)   Anesthesia Other Findings   Reproductive/Obstetrics (+) Pregnancy                             Anesthesia Physical Anesthesia Plan  ASA: III  Anesthesia Plan: Epidural   Post-op Pain Management:    Induction:   PONV Risk Score and Plan:   Airway Management Planned:   Additional Equipment:   Intra-op Plan:   Post-operative Plan:   Informed Consent: I have reviewed the patients History and Physical, chart, labs and discussed the procedure including the risks, benefits and alternatives for the proposed anesthesia with the patient or authorized representative who has indicated his/her understanding and acceptance.     Plan Discussed with:   Anesthesia Plan Comments:         Anesthesia Quick Evaluation

## 2017-12-28 NOTE — H&P (Signed)
Dawn Mullins is a 27 y.o. female presenting for Postdates IOL. OB History    Gravida  3   Para      Term      Preterm      AB  2   Living        SAB  1   TAB  1   Ectopic      Multiple      Live Births             Past Medical History:  Diagnosis Date  . Chlamydia    Past Surgical History:  Procedure Laterality Date  . DILATION AND CURETTAGE OF UTERUS     Family History: family history includes Breast cancer in her maternal grandmother; Diabetes in her maternal grandfather; Heart attack in her maternal grandfather. Social History:  reports that she has never smoked. She has never used smokeless tobacco. She reports that she does not drink alcohol or use drugs.     Maternal Diabetes: No Genetic Screening: Normal Maternal Ultrasounds/Referrals: Normal Fetal Ultrasounds or other Referrals:  None Maternal Substance Abuse:  No Significant Maternal Medications:  None Significant Maternal Lab Results:  None Other Comments:  None  Review of Systems  Constitutional: Negative.   All other systems reviewed and are negative.  Maternal Medical History:  Reason for admission: Contractions.   Contractions: Onset was 3-5 hours ago.   Frequency: irregular.   Perceived severity is mild.    Fetal activity: Perceived fetal activity is normal.   Last perceived fetal movement was within the past hour.    Prenatal complications: no prenatal complications Prenatal Complications - Diabetes: none.    Dilation: 1.5 Effacement (%): Thick Station: -3 Exam by:: Sandy SalaamLynnsey Johnson, RN Blood pressure 120/71, pulse 72, temperature 97.8 F (36.6 C), temperature source Oral, resp. rate 16, height 5\' 5"  (1.651 m), weight 124.7 kg (275 lb), unknown if currently breastfeeding. Maternal Exam:  Uterine Assessment: Contraction strength is mild.  Contraction frequency is irregular.   Abdomen: Patient reports no abdominal tenderness. Fetal presentation: vertex  Introitus:  Normal vulva. Normal vagina.  Ferning test: not done.  Nitrazine test: not done. Amniotic fluid character: not assessed.  Pelvis: adequate for delivery.   Cervix: Cervix evaluated by digital exam.     Physical Exam  Nursing note and vitals reviewed. Constitutional: She is oriented to person, place, and time. She appears well-developed and well-nourished.  HENT:  Head: Normocephalic and atraumatic.  Neck: Normal range of motion. Neck supple.  Cardiovascular: Normal rate and regular rhythm.  Respiratory: Effort normal and breath sounds normal.  GI: Soft. Bowel sounds are normal.  Genitourinary: Vagina normal and uterus normal.  Musculoskeletal: Normal range of motion.  Neurological: She is alert and oriented to person, place, and time. She has normal reflexes.  Skin: Skin is warm and dry.  Psychiatric: She has a normal mood and affect.    Prenatal labs: ABO, Rh: --/--/B POS (06/21 0055) Antibody: NEG (06/21 0055) Rubella: Immune (11/06 0000) RPR: Nonreactive (11/06 0000)  HBsAg: Negative (11/06 0000)  HIV: Non-reactive (11/06 0000)  GBS: Negative (06/11 0000)   Assessment/Plan: Postdates IOL   Dawn Mullins J 12/28/2017, 6:33 AM

## 2017-12-28 NOTE — Anesthesia Procedure Notes (Signed)
Epidural Patient location during procedure: OB  Staffing Anesthesiologist: Koston Hennes, MD Performed: anesthesiologist   Preanesthetic Checklist Completed: patient identified, pre-op evaluation, timeout performed, IV checked, risks and benefits discussed and monitors and equipment checked  Epidural Patient position: sitting Prep: site prepped and draped and DuraPrep Patient monitoring: heart rate, continuous pulse ox and blood pressure Approach: midline Location: L3-L4 Injection technique: LOR air and LOR saline  Needle:  Needle type: Tuohy  Needle gauge: 17 G Needle length: 9 cm Needle insertion depth: 7 cm Catheter type: closed end flexible Catheter size: 19 Gauge Catheter at skin depth: 12 cm Test dose: negative  Assessment Sensory level: T8 Events: blood not aspirated, injection not painful, no injection resistance, negative IV test and no paresthesia  Additional Notes Reason for block:procedure for pain     

## 2017-12-28 NOTE — Consult Note (Signed)
Asked by RN to assess newborn due to grunting which was noted during skin-to-skin with mother. Upon my arrival, infant was 3221 minutes old and pulse oximeter was reading 98%. RN reported that she had bulb suctioned mouth and nares, and performed chest PT after which grunting had improved. She was clear to auscultation upon my assessment. Observed for several minutes during which her saturations remained in the high 90's.  Updated the family and asked RN to call back for any further concern.   Dawn HahnJennifer Quynh Mullins, NNP-BC

## 2017-12-28 NOTE — Progress Notes (Signed)
Dawn NimrodCristal Guzman-Serrano is a 27 y.o. G3P0020 at 5921w4d by LMP admitted for induction of labor due to Post dates. .  Subjective: Comfortable with epidural  Objective: BP 112/71   Pulse 83   Temp 98 F (36.7 C)   Resp 16   Ht 5\' 5"  (1.651 m)   Wt 124.7 kg (275 lb)   SpO2 99%   BMI 45.76 kg/m  No intake/output data recorded. No intake/output data recorded.  FHT:  FHR: 145 bpm, variability: moderate,  accelerations:  Present,  decelerations:  Absent UC:   irregular, every 4 minutes SVE:   Dilation: 3.5 Effacement (%): 80 Station: -1 Exam by:: Dr Billy Coastaavon  IUPC placed without difficulty Labs: Lab Results  Component Value Date   WBC 14.3 (H) 12/28/2017   HGB 11.7 (L) 12/28/2017   HCT 33.9 (L) 12/28/2017   MCV 82.5 12/28/2017   PLT 269 12/28/2017    Assessment / Plan: Induction of labor due to postterm,  progressing well on pitocin  Labor: Progressing normally Preeclampsia:  no signs or symptoms of toxicity Fetal Wellbeing:  Category I Pain Control:  Epidural I/D:  n/a Anticipated MOD:  NSVD  Rodrigo Mcgranahan J 12/28/2017, 12:30 PM

## 2017-12-28 NOTE — Anesthesia Pain Management Evaluation Note (Signed)
  CRNA Pain Management Visit Note  Patient: Dawn Mullins, 27 y.o., female  "Hello I am a member of the anesthesia team at [facility]. We have an anesthesia team available at all times to provide care throughout the hospital, including epidural management and anesthesia for C-section. I don't know your plan for the delivery whether it a natural birth, water birth, IV sedation, nitrous supplementation, doula or epidural, but we want to meet your pain goals."   1.Was your pain managed to your expectations on prior hospitalizations?   No prior hospitalizations  2.What is your expectation for pain management during this hospitalization?     IV pain meds, may choose epidural, undecided   3.How can we help you reach that goal? Support PRN  Record the patient's initial score and the patient's pain goal.   Pain: 5  Pain Goal: 2 [facility] wants you to be able to say your pain was always managed very well.  Jennelle HumanLisa B Shea Swalley 12/28/2017

## 2017-12-28 NOTE — Progress Notes (Signed)
Dawn Mullins is a 27 y.o. G3P0020 at 4070w4d by LMP admitted for induction of labor due to Post dates. .  Subjective: Comfortable with epidural  Objective: BP 113/75   Pulse 87   Temp 98.8 F (37.1 C) (Oral)   Resp 16   Ht 5\' 5"  (1.651 m)   Wt 124.7 kg (275 lb)   SpO2 99%   BMI 45.76 kg/m  I/O last 3 completed shifts: In: -  Out: 1250 [Urine:1250] No intake/output data recorded.  FHT:  FHR: 145 bpm, variability: moderate,  accelerations:  Present,  decelerations:  Absent UC:   irregular, every 4 minutes SVE:   Dilation: 5.5 Effacement (%): 100 Station: -1 Exam by::  dr Billy Coastaavon  IUPC placed without difficulty Labs: Lab Results  Component Value Date   WBC 14.3 (H) 12/28/2017   HGB 11.7 (L) 12/28/2017   HCT 33.9 (L) 12/28/2017   MCV 82.5 12/28/2017   PLT 269 12/28/2017    Assessment / Plan: Induction of labor due to postterm,  progressing well on pitocin  Labor: Progressing normally Preeclampsia:  no signs or symptoms of toxicity Fetal Wellbeing:  Category I Pain Control:  Epidural I/D:  n/a Anticipated MOD:  NSVD  Dawn Mullins J 12/28/2017, 8:47 PM

## 2017-12-29 ENCOUNTER — Encounter (HOSPITAL_COMMUNITY): Payer: Self-pay

## 2017-12-29 LAB — CBC
HCT: 31.7 % — ABNORMAL LOW (ref 36.0–46.0)
Hemoglobin: 10.9 g/dL — ABNORMAL LOW (ref 12.0–15.0)
MCH: 28.2 pg (ref 26.0–34.0)
MCHC: 34.4 g/dL (ref 30.0–36.0)
MCV: 82.1 fL (ref 78.0–100.0)
PLATELETS: 236 10*3/uL (ref 150–400)
RBC: 3.86 MIL/uL — AB (ref 3.87–5.11)
RDW: 13.3 % (ref 11.5–15.5)
WBC: 21 10*3/uL — AB (ref 4.0–10.5)

## 2017-12-29 MED ORDER — COCONUT OIL OIL
1.0000 "application " | TOPICAL_OIL | Status: DC | PRN
  Filled 2017-12-29: qty 120

## 2017-12-29 MED ORDER — ACETAMINOPHEN 325 MG PO TABS
650.0000 mg | ORAL_TABLET | ORAL | Status: DC | PRN
  Administered 2017-12-29: 650 mg via ORAL
  Filled 2017-12-29: qty 2

## 2017-12-29 MED ORDER — WITCH HAZEL-GLYCERIN EX PADS: 1.0000 "application " | MEDICATED_PAD | CUTANEOUS | Status: DC | PRN

## 2017-12-29 MED ORDER — SIMETHICONE 80 MG PO CHEW: 80.0000 mg | CHEWABLE_TABLET | ORAL | Status: DC | PRN

## 2017-12-29 MED ORDER — DIBUCAINE 1 % RE OINT: 1.0000 "application " | TOPICAL_OINTMENT | RECTAL | Status: DC | PRN

## 2017-12-29 MED ORDER — SENNOSIDES-DOCUSATE SODIUM 8.6-50 MG PO TABS
2.0000 | ORAL_TABLET | ORAL | Status: DC
  Administered 2017-12-29 (×2): 2 via ORAL
  Filled 2017-12-29 (×2): qty 2

## 2017-12-29 MED ORDER — ONDANSETRON HCL 4 MG PO TABS: 4.0000 mg | ORAL_TABLET | ORAL | Status: DC | PRN

## 2017-12-29 MED ORDER — IBUPROFEN 600 MG PO TABS
600.0000 mg | ORAL_TABLET | Freq: Four times a day (QID) | ORAL | Status: DC
  Administered 2017-12-29 – 2017-12-30 (×6): 600 mg via ORAL
  Filled 2017-12-29 (×5): qty 1

## 2017-12-29 MED ORDER — ZOLPIDEM TARTRATE 5 MG PO TABS: 5.0000 mg | ORAL_TABLET | Freq: Every evening | ORAL | Status: DC | PRN

## 2017-12-29 MED ORDER — METHYLERGONOVINE MALEATE 0.2 MG/ML IJ SOLN: 0.2000 mg | INTRAMUSCULAR | Status: DC | PRN

## 2017-12-29 MED ORDER — METHYLERGONOVINE MALEATE 0.2 MG PO TABS: 0.2000 mg | ORAL_TABLET | ORAL | Status: DC | PRN

## 2017-12-29 MED ORDER — PRENATAL MULTIVITAMIN CH
1.0000 | ORAL_TABLET | Freq: Every day | ORAL | Status: DC
  Administered 2017-12-30: 1 via ORAL
  Filled 2017-12-29: qty 1

## 2017-12-29 MED ORDER — OXYCODONE-ACETAMINOPHEN 5-325 MG PO TABS: 2.0000 | ORAL_TABLET | ORAL | Status: DC | PRN

## 2017-12-29 MED ORDER — BENZOCAINE-MENTHOL 20-0.5 % EX AERO
1.0000 "application " | INHALATION_SPRAY | CUTANEOUS | Status: DC | PRN
  Administered 2017-12-29: 1 via TOPICAL
  Filled 2017-12-29 (×2): qty 56

## 2017-12-29 MED ORDER — OXYCODONE-ACETAMINOPHEN 5-325 MG PO TABS: 1.0000 | ORAL_TABLET | ORAL | Status: DC | PRN

## 2017-12-29 MED ORDER — TETANUS-DIPHTH-ACELL PERTUSSIS 5-2.5-18.5 LF-MCG/0.5 IM SUSP: 0.5000 mL | Freq: Once | INTRAMUSCULAR | Status: DC

## 2017-12-29 MED ORDER — DIPHENHYDRAMINE HCL 25 MG PO CAPS: 25.0000 mg | ORAL_CAPSULE | Freq: Four times a day (QID) | ORAL | Status: DC | PRN

## 2017-12-29 MED ORDER — ONDANSETRON HCL 4 MG/2ML IJ SOLN: 4.0000 mg | INTRAMUSCULAR | Status: DC | PRN

## 2017-12-29 NOTE — Anesthesia Postprocedure Evaluation (Signed)
Anesthesia Post Note  Patient: Dawn Mullins  Procedure(s) Performed: AN AD HOC LABOR EPIDURAL     Patient location during evaluation: Mother Baby Anesthesia Type: Epidural Level of consciousness: awake and alert Pain management: pain level controlled Vital Signs Assessment: post-procedure vital signs reviewed and stable Respiratory status: spontaneous breathing, nonlabored ventilation and respiratory function stable Cardiovascular status: stable Postop Assessment: no headache, no backache, epidural receding, no apparent nausea or vomiting, patient able to bend at knees, able to ambulate and adequate PO intake Anesthetic complications: no    Last Vitals:  Vitals:   12/29/17 0155 12/29/17 0454  BP: 112/73 110/62  Pulse: 88 76  Resp: 18 16  Temp: 36.9 C 37 C  SpO2:      Last Pain:  Vitals:   12/29/17 0454  TempSrc: Oral  PainSc:    Pain Goal: Patients Stated Pain Goal: 2 (12/28/17 1931)               Laban EmperorMalinova,[name]

## 2017-12-29 NOTE — Lactation Note (Signed)
This note was copied from a baby's chart. Lactation Consultation Note  Patient Name: Dawn Lorella NimrodCristal Guzman-Serrano [id number]'s Date: 12/29/2017 Reason for consult: Initial assessment;Primapara;1st time breastfeeding;Term  P1 mother whose infant is now 188 hours old.    Mother has breastfed 3 times since delivery and feels like baby has latched well every time.  She feels a good tug with feeds but no pain.  RN in room and also agreed that breastfeeding has been going well.  Encouraged mother to feed 8-12 times/24 hours or earlier if baby shows feeding cues.  Reviewed feeding cues.  Continue STS, breast massage and hand expression.  Mother can do a return demonstration of hand expression.  She plans to finger feed or spoon feed any EBM she obtains.  Mom made aware of O/P services, breastfeeding support groups, community resources, and our phone # for post-discharge questions.  Family present. Mother will call for assistance as needed.   Maternal Data Formula Feeding for Exclusion: No Has patient been taught Hand Expression?: Yes Does the patient have breastfeeding experience prior to this delivery?: No  Feeding Feeding Type: Breast Fed Length of feed: 5 min  LATCH Score                   Interventions    Lactation Tools Discussed/Used     Consult Status Consult Status: Follow-up Date: 12/30/17 Follow-up type: In-patient    Mae Denunzio R Ibrahima Holberg 12/29/2017, 7:07 AM

## 2017-12-29 NOTE — Progress Notes (Signed)
PPD #1 SVD w/ second degree perineal lac Girl "Dawn Mullins"  S:  Reports feeling good and ready for a shower             Tolerating po/ No nausea or vomiting             Bleeding is light             Pain controlled with acetaminophen and ibuprofen (OTC)             Up ad lib / ambulatory / voiding w/o difficulty  Newborn Breastfeeding, no complications  O:   VS: BP 112/68 (BP Location: Left Arm)   Pulse 79   Temp 98.4 F (36.9 C) (Oral)   Resp 16   LABS:              Recent Labs    12/28/17 0055 12/29/17 0544  WBC 14.3* 21.0*  HGB 11.7* 10.9*  PLT 269 236   Blood type: --/--/B POS (06/21 0055) Rubella: Immune (11/06 0000)                    I&O: Intake/Output      06/21 0701 - 06/22 0700 06/22 0701 - 06/23 0700   Urine (mL/kg/hr) 1750 (0.6)    Blood 200    Total Output 1950    Net -1950          Physical Exam:             Alert and oriented X3  Lungs: Clear and unlabored  Heart: regular rate and rhythm / no mumurs  Abdomen: soft, non-tender, non-distended              Fundus: firm, non-tender, U-2  Perineum: well-approximated, mildly edematous  Lochia: small  Extremities: No edema, no calf pain, cords, or tenderness    A:  26 y.o. G3 P0021 PPD #1 SVD w/ second degree perineal lac Breastfeeding Stable    P:  Routine post partum orders Perineal hygiene Lactation support PRN Anticipate DC in AM  Dawn Mullins, [facility] 12/29/2017 10:50 AM

## 2017-12-30 ENCOUNTER — Encounter (HOSPITAL_COMMUNITY): Payer: Self-pay | Admitting: *Deleted

## 2017-12-30 MED ORDER — IBUPROFEN 600 MG PO TABS: 600.0000 mg | ORAL_TABLET | Freq: Four times a day (QID) | ORAL | 0 refills | Status: DC

## 2017-12-30 MED ORDER — COCONUT OIL OIL: 1.0000 "application " | TOPICAL_OIL | 0 refills | Status: DC | PRN

## 2017-12-30 MED ORDER — BENZOCAINE-MENTHOL 20-0.5 % EX AERO: 1.0000 "application " | INHALATION_SPRAY | CUTANEOUS | Status: DC | PRN

## 2017-12-30 MED ORDER — ACETAMINOPHEN 325 MG PO TABS: 650.0000 mg | ORAL_TABLET | ORAL | Status: AC | PRN

## 2017-12-30 NOTE — Progress Notes (Signed)
PPD #2 SVD w/ second degree perineal lac Girl "Dawn Mullins"  S:         Reports feeling good and ready to go home             Tolerating po/ No nausea or vomiting             Bleeding is light             Pain controlled with acetaminophen and ibuprofen (OTC)             Up ad lib / ambulatory / voiding w/o difficulty             Newborn Breastfeeding, no complications O:      Vitals:   12/29/17 0454 12/29/17 1010 12/29/17 1540 12/29/17 2135  BP: 110/62 112/68 116/71 115/67  Pulse: 76 79 86 76  Resp: 16  14 16   Temp: 98.6 F (37 C) 98.4 F (36.9 C) 98.2 F (36.8 C) 97.7 F (36.5 C)  TempSrc: Oral Oral Oral Oral  SpO2:      Weight:      Height:         Recent Labs    12/28/17 0055 12/29/17 0544  WBC 14.3* 21.0*  HGB 11.7* 10.9*  HCT 33.9* 31.7*  PLT 269 236    Blood type: --/--/B POS (06/21 0055) Rubella: Immune (11/06 0000)    Physical Exam:  General: alert, cooperative and no distress  Abdomen: soft, non-tender, non-distended   Uterine Fundus: firm, -2 Lochia: appropriate Perineum: repair healing, edema trace DVT Evaluation: No evidence of DVT seen on physical exam.  A:          27 y.o. G3 P0021 PPD #2  - normal postpartum exam SVD w/ second degree perineal lac Breastfeeding Stable   P: Continue current postpartum care DC home today w/ instructions [facility] and PRN   LOS: 2 days   Dawn Mullins, SNM 12/30/2017, 1:08 PM

## 2017-12-30 NOTE — Lactation Note (Signed)
This note was copied from a baby's chart. Lactation Consultation Note  Patient Name: Dawn Mullins [id number]'s Date: 12/30/2017   P1, Baby 37 hours old.   Mother hand expressed good flow before latching. Baby latched in football hold w/ sucks and swallows. Mom encouraged to feed baby 8-12 times/24 hours and with feeding cues.  Reviewed engorgement care and monitoring voids/stools.     Maternal Data    Feeding    LATCH Score Latch: (instructed MOB to call for next latch)                 Interventions    Lactation Tools Discussed/Used     Consult Status      [name], [name] 12/30/2017, 11:22 AM

## 2018-01-02 ENCOUNTER — Encounter (HOSPITAL_COMMUNITY): Payer: Self-pay

## 2018-01-04 NOTE — Discharge Summary (Signed)
Obstetric Discharge Summary Reason for Admission: induction of labor and post dates Prenatal Procedures: ultrasound Intrapartum Procedures: spontaneous vaginal delivery and epidural Postpartum Procedures: none Complications-Operative and Postpartum: 2nd degree perineal laceration Hemoglobin  Date Value Ref Range Status  12/29/2017 10.9 (L) 12.0 - 15.0 g/dL Final   HCT  Date Value Ref Range Status  12/29/2017 31.7 (L) 36.0 - 46.0 % Final    Physical Exam:  General: alert, cooperative and no distress Lochia: appropriate Uterine Fundus: firm Incision: healing well DVT Evaluation: No cords or calf tenderness. No significant calf/ankle edema.  Discharge Diagnoses:  Patient Active Problem List   Diagnosis Date Noted  . Obstetrical laceration, second degree, vaginal 12/29/2017  . Encounter for planned induction of labor 12/28/2017  . SVD (spontaneous vaginal delivery) 6/21 12/28/2017     Discharge Information: Date: 01/04/2018 Activity: pelvic rest Diet: routine Medications:  Allergies as of 12/30/2017   No Known Allergies     Medication List    TAKE these medications   acetaminophen 325 MG tablet Commonly known as:  TYLENOL Take 2 tablets (650 mg total) by mouth every 4 (four) hours as needed (for pain scale < 4).   benzocaine-Menthol 20-0.5 % Aero Commonly known as:  DERMOPLAST Apply 1 application topically as needed for irritation (perineal discomfort).   coconut oil Oil Apply 1 application topically as needed.   ibuprofen 600 MG tablet Commonly known as:  ADVIL,MOTRIN Take 1 tablet (600 mg total) by mouth every 6 (six) hours.   prenatal multivitamin Tabs tablet Take 1 tablet by mouth daily at 12 noon.            Discharge Care Instructions  (From admission, onward)        Start     Ordered   12/30/17 0000  Discharge wound care:    Comments:  Sitz baths 2 times /day with warm water x 1 week   12/30/17 [id number]     Condition: stable Instructions:  refer to practice specific booklet Discharge to: home   Newborn Data: Live born female Sara'i Birth Weight: 7 lb 4.6 oz (3306 g) APGAR: 9, 9  Newborn Delivery   Birth date/time:  12/28/2017 22:09:00 Delivery type:  Vaginal, Spontaneous     Home with mother.  Neta MendsDaniela C Mayre Bury, CNM 01/04/2018, 9:41 PM

## 2019-06-08 DIAGNOSIS — Z1159 Encounter for screening for other viral diseases: Secondary | ICD-10-CM | POA: Diagnosis not present

## 2019-07-11 NOTE — L&D Delivery Note (Signed)
Delivery Note At 7:19 PM a viable and healthy female was delivered via  (Presentation:  LOA    ).  APGAR: 8, 9; weight  pending.   Placenta status: Spontaneous, Intact.  Cord:   with the following complications: none .  Cord pH: na  Anesthesia: Epidural Episiotomy:  na Lacerations:  second Suture Repair: 2.0 vicryl rapide Est. Blood Loss (mL):  100  Mom to postpartum.  Baby to Couplet care / Skin to Skin.  Cybill Uriegas J 05/30/2020, 7:28 PM

## 2019-07-15 ENCOUNTER — Ambulatory Visit: Payer: BC Managed Care – PPO | Attending: Internal Medicine

## 2019-07-15 DIAGNOSIS — Z20822 Contact with and (suspected) exposure to covid-19: Secondary | ICD-10-CM | POA: Diagnosis not present

## 2019-07-17 LAB — NOVEL CORONAVIRUS, NAA: SARS-CoV-2, NAA: NOT DETECTED

## 2019-07-25 ENCOUNTER — Ambulatory Visit: Payer: BC Managed Care – PPO | Attending: Internal Medicine

## 2019-07-25 DIAGNOSIS — Z20822 Contact with and (suspected) exposure to covid-19: Secondary | ICD-10-CM | POA: Diagnosis not present

## 2019-07-26 LAB — NOVEL CORONAVIRUS, NAA: SARS-CoV-2, NAA: NOT DETECTED

## 2019-08-19 ENCOUNTER — Other Ambulatory Visit: Payer: BC Managed Care – PPO

## 2019-08-22 ENCOUNTER — Ambulatory Visit: Payer: BC Managed Care – PPO | Attending: Internal Medicine

## 2019-08-22 DIAGNOSIS — Z20822 Contact with and (suspected) exposure to covid-19: Secondary | ICD-10-CM | POA: Diagnosis not present

## 2019-08-23 LAB — NOVEL CORONAVIRUS, NAA: SARS-CoV-2, NAA: NOT DETECTED

## 2019-10-06 DIAGNOSIS — Z3200 Encounter for pregnancy test, result unknown: Secondary | ICD-10-CM | POA: Diagnosis not present

## 2019-10-06 DIAGNOSIS — Z3689 Encounter for other specified antenatal screening: Secondary | ICD-10-CM | POA: Diagnosis not present

## 2019-10-22 DIAGNOSIS — Z3201 Encounter for pregnancy test, result positive: Secondary | ICD-10-CM | POA: Diagnosis not present

## 2019-11-05 DIAGNOSIS — Z3689 Encounter for other specified antenatal screening: Secondary | ICD-10-CM | POA: Diagnosis not present

## 2019-11-05 DIAGNOSIS — Z3682 Encounter for antenatal screening for nuchal translucency: Secondary | ICD-10-CM | POA: Diagnosis not present

## 2019-11-05 DIAGNOSIS — Z3481 Encounter for supervision of other normal pregnancy, first trimester: Secondary | ICD-10-CM | POA: Diagnosis not present

## 2019-11-05 DIAGNOSIS — Z124 Encounter for screening for malignant neoplasm of cervix: Secondary | ICD-10-CM | POA: Diagnosis not present

## 2019-11-05 LAB — OB RESULTS CONSOLE HIV ANTIBODY (ROUTINE TESTING): HIV: NONREACTIVE

## 2019-11-05 LAB — OB RESULTS CONSOLE GC/CHLAMYDIA
Chlamydia: NEGATIVE
Gonorrhea: NEGATIVE

## 2019-11-05 LAB — OB RESULTS CONSOLE ABO/RH: RH Type: POSITIVE

## 2019-11-05 LAB — OB RESULTS CONSOLE ANTIBODY SCREEN: Antibody Screen: NEGATIVE

## 2019-11-05 LAB — OB RESULTS CONSOLE RPR: RPR: NONREACTIVE

## 2019-11-05 LAB — OB RESULTS CONSOLE HEPATITIS B SURFACE ANTIGEN: Hepatitis B Surface Ag: NEGATIVE

## 2019-11-05 LAB — OB RESULTS CONSOLE RUBELLA ANTIBODY, IGM: Rubella: IMMUNE

## 2019-11-27 DIAGNOSIS — Z3682 Encounter for antenatal screening for nuchal translucency: Secondary | ICD-10-CM | POA: Diagnosis not present

## 2019-11-27 DIAGNOSIS — Z118 Encounter for screening for other infectious and parasitic diseases: Secondary | ICD-10-CM | POA: Diagnosis not present

## 2019-11-27 DIAGNOSIS — Z3481 Encounter for supervision of other normal pregnancy, first trimester: Secondary | ICD-10-CM | POA: Diagnosis not present

## 2019-12-19 DIAGNOSIS — Z361 Encounter for antenatal screening for raised alphafetoprotein level: Secondary | ICD-10-CM | POA: Diagnosis not present

## 2019-12-19 DIAGNOSIS — Z3482 Encounter for supervision of other normal pregnancy, second trimester: Secondary | ICD-10-CM | POA: Diagnosis not present

## 2020-01-07 DIAGNOSIS — O358XX0 Maternal care for other (suspected) fetal abnormality and damage, not applicable or unspecified: Secondary | ICD-10-CM | POA: Diagnosis not present

## 2020-01-07 DIAGNOSIS — Z3A19 19 weeks gestation of pregnancy: Secondary | ICD-10-CM | POA: Diagnosis not present

## 2020-02-09 DIAGNOSIS — Z3A24 24 weeks gestation of pregnancy: Secondary | ICD-10-CM | POA: Diagnosis not present

## 2020-02-09 DIAGNOSIS — O358XX0 Maternal care for other (suspected) fetal abnormality and damage, not applicable or unspecified: Secondary | ICD-10-CM | POA: Diagnosis not present

## 2020-03-03 DIAGNOSIS — Z3689 Encounter for other specified antenatal screening: Secondary | ICD-10-CM | POA: Diagnosis not present

## 2020-03-03 DIAGNOSIS — O358XX0 Maternal care for other (suspected) fetal abnormality and damage, not applicable or unspecified: Secondary | ICD-10-CM | POA: Diagnosis not present

## 2020-03-03 DIAGNOSIS — Z3A27 27 weeks gestation of pregnancy: Secondary | ICD-10-CM | POA: Diagnosis not present

## 2020-03-17 DIAGNOSIS — Z3A29 29 weeks gestation of pregnancy: Secondary | ICD-10-CM | POA: Diagnosis not present

## 2020-03-17 DIAGNOSIS — Z3689 Encounter for other specified antenatal screening: Secondary | ICD-10-CM | POA: Diagnosis not present

## 2020-03-17 DIAGNOSIS — O358XX0 Maternal care for other (suspected) fetal abnormality and damage, not applicable or unspecified: Secondary | ICD-10-CM | POA: Diagnosis not present

## 2020-03-30 DIAGNOSIS — O358XX0 Maternal care for other (suspected) fetal abnormality and damage, not applicable or unspecified: Secondary | ICD-10-CM | POA: Diagnosis not present

## 2020-03-30 DIAGNOSIS — Z3A31 31 weeks gestation of pregnancy: Secondary | ICD-10-CM | POA: Diagnosis not present

## 2020-04-19 DIAGNOSIS — O358XX0 Maternal care for other (suspected) fetal abnormality and damage, not applicable or unspecified: Secondary | ICD-10-CM | POA: Diagnosis not present

## 2020-04-19 DIAGNOSIS — Z3A34 34 weeks gestation of pregnancy: Secondary | ICD-10-CM | POA: Diagnosis not present

## 2020-05-05 DIAGNOSIS — Z3685 Encounter for antenatal screening for Streptococcus B: Secondary | ICD-10-CM | POA: Diagnosis not present

## 2020-05-05 DIAGNOSIS — Z3A36 36 weeks gestation of pregnancy: Secondary | ICD-10-CM | POA: Diagnosis not present

## 2020-05-05 DIAGNOSIS — O358XX0 Maternal care for other (suspected) fetal abnormality and damage, not applicable or unspecified: Secondary | ICD-10-CM | POA: Diagnosis not present

## 2020-05-05 LAB — OB RESULTS CONSOLE GBS: GBS: NEGATIVE

## 2020-05-13 DIAGNOSIS — O358XX0 Maternal care for other (suspected) fetal abnormality and damage, not applicable or unspecified: Secondary | ICD-10-CM | POA: Diagnosis not present

## 2020-05-13 DIAGNOSIS — Z3A37 37 weeks gestation of pregnancy: Secondary | ICD-10-CM | POA: Diagnosis not present

## 2020-05-19 DIAGNOSIS — O358XX0 Maternal care for other (suspected) fetal abnormality and damage, not applicable or unspecified: Secondary | ICD-10-CM | POA: Diagnosis not present

## 2020-05-19 DIAGNOSIS — Z3A38 38 weeks gestation of pregnancy: Secondary | ICD-10-CM | POA: Diagnosis not present

## 2020-05-20 ENCOUNTER — Encounter (HOSPITAL_COMMUNITY): Payer: Self-pay | Admitting: *Deleted

## 2020-05-20 ENCOUNTER — Telehealth (HOSPITAL_COMMUNITY): Payer: Self-pay | Admitting: *Deleted

## 2020-05-20 NOTE — Telephone Encounter (Signed)
Preadmission screen  

## 2020-05-26 DIAGNOSIS — Z3A39 39 weeks gestation of pregnancy: Secondary | ICD-10-CM | POA: Diagnosis not present

## 2020-05-26 DIAGNOSIS — O4103X0 Oligohydramnios, third trimester, not applicable or unspecified: Secondary | ICD-10-CM | POA: Diagnosis not present

## 2020-05-28 ENCOUNTER — Other Ambulatory Visit (HOSPITAL_COMMUNITY)
Admission: RE | Admit: 2020-05-28 | Discharge: 2020-05-28 | Disposition: A | Discharge status: Home / Self Care | Payer: BC Managed Care – PPO | Source: Ambulatory Visit | Attending: Obstetrics and Gynecology | Admitting: Obstetrics and Gynecology

## 2020-05-28 ENCOUNTER — Other Ambulatory Visit: Payer: Self-pay | Admitting: Obstetrics and Gynecology

## 2020-05-28 DIAGNOSIS — Z01812 Encounter for preprocedural laboratory examination: Secondary | ICD-10-CM | POA: Insufficient documentation

## 2020-05-28 DIAGNOSIS — O4103X0 Oligohydramnios, third trimester, not applicable or unspecified: Secondary | ICD-10-CM | POA: Diagnosis not present

## 2020-05-28 DIAGNOSIS — Z20822 Contact with and (suspected) exposure to covid-19: Secondary | ICD-10-CM | POA: Insufficient documentation

## 2020-05-28 DIAGNOSIS — O99214 Obesity complicating childbirth: Secondary | ICD-10-CM | POA: Diagnosis not present

## 2020-05-28 DIAGNOSIS — Z3A39 39 weeks gestation of pregnancy: Secondary | ICD-10-CM | POA: Diagnosis not present

## 2020-05-28 DIAGNOSIS — Z23 Encounter for immunization: Secondary | ICD-10-CM | POA: Diagnosis not present

## 2020-05-28 LAB — SARS CORONAVIRUS 2 (TAT 6-24 HRS): SARS Coronavirus 2: NEGATIVE

## 2020-05-30 ENCOUNTER — Inpatient Hospital Stay (HOSPITAL_COMMUNITY): Payer: BC Managed Care – PPO | Admitting: Anesthesiology

## 2020-05-30 ENCOUNTER — Inpatient Hospital Stay (HOSPITAL_COMMUNITY)
Admission: AD | Admit: 2020-05-30 | Discharge: 2020-06-01 | DRG: 807 | Disposition: A | Discharge status: Home / Self Care | Payer: BC Managed Care – PPO | Attending: Obstetrics and Gynecology | Admitting: Obstetrics and Gynecology

## 2020-05-30 ENCOUNTER — Other Ambulatory Visit: Payer: Self-pay

## 2020-05-30 ENCOUNTER — Encounter (HOSPITAL_COMMUNITY): Payer: Self-pay | Admitting: Obstetrics and Gynecology

## 2020-05-30 ENCOUNTER — Inpatient Hospital Stay (HOSPITAL_COMMUNITY): Payer: BC Managed Care – PPO

## 2020-05-30 DIAGNOSIS — O4103X0 Oligohydramnios, third trimester, not applicable or unspecified: Principal | ICD-10-CM | POA: Diagnosis present

## 2020-05-30 DIAGNOSIS — Z20822 Contact with and (suspected) exposure to covid-19: Secondary | ICD-10-CM | POA: Diagnosis present

## 2020-05-30 DIAGNOSIS — Z3A39 39 weeks gestation of pregnancy: Secondary | ICD-10-CM

## 2020-05-30 DIAGNOSIS — Z3490 Encounter for supervision of normal pregnancy, unspecified, unspecified trimester: Secondary | ICD-10-CM | POA: Diagnosis present

## 2020-05-30 DIAGNOSIS — O99214 Obesity complicating childbirth: Secondary | ICD-10-CM | POA: Diagnosis present

## 2020-05-30 LAB — CBC
HCT: 36.7 % (ref 36.0–46.0)
Hemoglobin: 12.2 g/dL (ref 12.0–15.0)
MCH: 27.5 pg (ref 26.0–34.0)
MCHC: 33.2 g/dL (ref 30.0–36.0)
MCV: 82.7 fL (ref 80.0–100.0)
Platelets: 298 10*3/uL (ref 150–400)
RBC: 4.44 MIL/uL (ref 3.87–5.11)
RDW: 13.4 % (ref 11.5–15.5)
WBC: 11.6 10*3/uL — ABNORMAL HIGH (ref 4.0–10.5)
nRBC: 0 % (ref 0.0–0.2)

## 2020-05-30 LAB — TYPE AND SCREEN
ABO/RH(D): B POS
Antibody Screen: NEGATIVE

## 2020-05-30 LAB — RPR: RPR Ser Ql: NONREACTIVE

## 2020-05-30 MED ORDER — PHENYLEPHRINE 40 MCG/ML (10ML) SYRINGE FOR IV PUSH (FOR BLOOD PRESSURE SUPPORT)
80.0000 ug | PREFILLED_SYRINGE | INTRAVENOUS | Status: DC | PRN
  Filled 2020-05-30: qty 10

## 2020-05-30 MED ORDER — LIDOCAINE HCL (PF) 1 % IJ SOLN: 30.0000 mL | INTRAMUSCULAR | Status: DC | PRN

## 2020-05-30 MED ORDER — DIPHENHYDRAMINE HCL 50 MG/ML IJ SOLN: 12.5000 mg | INTRAMUSCULAR | Status: DC | PRN

## 2020-05-30 MED ORDER — COCONUT OIL OIL
1.0000 "application " | TOPICAL_OIL | Status: DC | PRN
  Administered 2020-06-01: 1 via TOPICAL

## 2020-05-30 MED ORDER — FENTANYL-BUPIVACAINE-NACL 0.5-0.125-0.9 MG/250ML-% EP SOLN
12.0000 mL/h | EPIDURAL | Status: DC | PRN
  Filled 2020-05-30: qty 250

## 2020-05-30 MED ORDER — SOD CITRATE-CITRIC ACID 500-334 MG/5ML PO SOLN: 30.0000 mL | ORAL | Status: DC | PRN

## 2020-05-30 MED ORDER — EPHEDRINE 5 MG/ML INJ: 10.0000 mg | INTRAVENOUS | Status: DC | PRN

## 2020-05-30 MED ORDER — DIPHENHYDRAMINE HCL 25 MG PO CAPS: 25.0000 mg | ORAL_CAPSULE | Freq: Four times a day (QID) | ORAL | Status: DC | PRN

## 2020-05-30 MED ORDER — OXYTOCIN BOLUS FROM INFUSION
333.0000 mL | Freq: Once | INTRAVENOUS | Status: AC
  Administered 2020-05-30: 333 mL via INTRAVENOUS

## 2020-05-30 MED ORDER — LIDOCAINE HCL (PF) 1 % IJ SOLN
INTRAMUSCULAR | Status: DC | PRN
  Administered 2020-05-30 (×2): 8 mL via EPIDURAL

## 2020-05-30 MED ORDER — BENZOCAINE-MENTHOL 20-0.5 % EX AERO
1.0000 "application " | INHALATION_SPRAY | CUTANEOUS | Status: DC | PRN
  Administered 2020-05-31: 1 via TOPICAL
  Filled 2020-05-30: qty 56

## 2020-05-30 MED ORDER — WITCH HAZEL-GLYCERIN EX PADS: 1.0000 "application " | MEDICATED_PAD | CUTANEOUS | Status: DC | PRN

## 2020-05-30 MED ORDER — DIBUCAINE (PERIANAL) 1 % EX OINT: 1.0000 "application " | TOPICAL_OINTMENT | CUTANEOUS | Status: DC | PRN

## 2020-05-30 MED ORDER — ONDANSETRON HCL 4 MG/2ML IJ SOLN: 4.0000 mg | INTRAMUSCULAR | Status: DC | PRN

## 2020-05-30 MED ORDER — PHENYLEPHRINE 40 MCG/ML (10ML) SYRINGE FOR IV PUSH (FOR BLOOD PRESSURE SUPPORT): 80.0000 ug | PREFILLED_SYRINGE | INTRAVENOUS | Status: DC | PRN

## 2020-05-30 MED ORDER — SENNOSIDES-DOCUSATE SODIUM 8.6-50 MG PO TABS
2.0000 | ORAL_TABLET | ORAL | Status: DC
  Administered 2020-05-30 – 2020-06-01 (×2): 2 via ORAL
  Filled 2020-05-30 (×2): qty 2

## 2020-05-30 MED ORDER — SODIUM CHLORIDE (PF) 0.9 % IJ SOLN
INTRAMUSCULAR | Status: DC | PRN
  Administered 2020-05-30: 12 mL/h via EPIDURAL

## 2020-05-30 MED ORDER — METHYLERGONOVINE MALEATE 0.2 MG PO TABS: 0.2000 mg | ORAL_TABLET | ORAL | Status: DC | PRN

## 2020-05-30 MED ORDER — IBUPROFEN 600 MG PO TABS
600.0000 mg | ORAL_TABLET | Freq: Four times a day (QID) | ORAL | Status: DC
  Administered 2020-05-30 – 2020-06-01 (×7): 600 mg via ORAL
  Filled 2020-05-30 (×7): qty 1

## 2020-05-30 MED ORDER — METHYLERGONOVINE MALEATE 0.2 MG/ML IJ SOLN: 0.2000 mg | INTRAMUSCULAR | Status: DC | PRN

## 2020-05-30 MED ORDER — LACTATED RINGERS IV SOLN: 500.0000 mL | INTRAVENOUS | Status: DC | PRN

## 2020-05-30 MED ORDER — OXYTOCIN-SODIUM CHLORIDE 30-0.9 UT/500ML-% IV SOLN
1.0000 m[IU]/min | INTRAVENOUS | Status: DC
  Administered 2020-05-30: 2 m[IU]/min via INTRAVENOUS
  Filled 2020-05-30: qty 500

## 2020-05-30 MED ORDER — ONDANSETRON HCL 4 MG PO TABS: 4.0000 mg | ORAL_TABLET | ORAL | Status: DC | PRN

## 2020-05-30 MED ORDER — LACTATED RINGERS IV SOLN: 500.0000 mL | Freq: Once | INTRAVENOUS | Status: DC

## 2020-05-30 MED ORDER — OXYTOCIN-SODIUM CHLORIDE 30-0.9 UT/500ML-% IV SOLN: 2.5000 [IU]/h | INTRAVENOUS | Status: DC

## 2020-05-30 MED ORDER — ZOLPIDEM TARTRATE 5 MG PO TABS: 5.0000 mg | ORAL_TABLET | Freq: Every evening | ORAL | Status: DC | PRN

## 2020-05-30 MED ORDER — OXYCODONE-ACETAMINOPHEN 5-325 MG PO TABS: 2.0000 | ORAL_TABLET | ORAL | Status: DC | PRN

## 2020-05-30 MED ORDER — TETANUS-DIPHTH-ACELL PERTUSSIS 5-2.5-18.5 LF-MCG/0.5 IM SUSY: 0.5000 mL | PREFILLED_SYRINGE | Freq: Once | INTRAMUSCULAR | Status: DC

## 2020-05-30 MED ORDER — OXYCODONE-ACETAMINOPHEN 5-325 MG PO TABS: 1.0000 | ORAL_TABLET | ORAL | Status: DC | PRN

## 2020-05-30 MED ORDER — TERBUTALINE SULFATE 1 MG/ML IJ SOLN: 0.2500 mg | Freq: Once | INTRAMUSCULAR | Status: DC | PRN

## 2020-05-30 MED ORDER — LACTATED RINGERS IV SOLN
INTRAVENOUS | Status: DC
  Administered 2020-05-30 (×2): 125 mL/h via INTRAVENOUS

## 2020-05-30 MED ORDER — ACETAMINOPHEN 325 MG PO TABS: 650.0000 mg | ORAL_TABLET | ORAL | Status: DC | PRN

## 2020-05-30 MED ORDER — SIMETHICONE 80 MG PO CHEW: 80.0000 mg | CHEWABLE_TABLET | ORAL | Status: DC | PRN

## 2020-05-30 MED ORDER — FENTANYL CITRATE (PF) 100 MCG/2ML IJ SOLN: 100.0000 ug | INTRAMUSCULAR | Status: DC | PRN

## 2020-05-30 MED ORDER — PRENATAL MULTIVITAMIN CH
1.0000 | ORAL_TABLET | Freq: Every day | ORAL | Status: DC
  Administered 2020-05-31 – 2020-06-01 (×2): 1 via ORAL
  Filled 2020-05-30 (×2): qty 1

## 2020-05-30 MED ORDER — ONDANSETRON HCL 4 MG/2ML IJ SOLN: 4.0000 mg | Freq: Four times a day (QID) | INTRAMUSCULAR | Status: DC | PRN

## 2020-05-30 NOTE — Progress Notes (Signed)
Mom states that she does not want to see lactation at this time

## 2020-05-30 NOTE — Anesthesia Procedure Notes (Signed)
Epidural Patient location during procedure: OB Start time: 05/30/2020 5:32 PM End time: 05/30/2020 5:42 PM  Staffing Anesthesiologist: Mellody Dance, MD Performed: anesthesiologist   Preanesthetic Checklist Completed: patient identified, IV checked, site marked, risks and benefits discussed, monitors and equipment checked, pre-op evaluation and timeout performed  Epidural Patient position: sitting Prep: DuraPrep Patient monitoring: heart rate, cardiac monitor, continuous pulse ox and blood pressure Approach: midline Location: L2-L3 Injection technique: LOR saline  Needle:  Needle type: Tuohy  Needle gauge: 17 G Needle length: 9 cm Needle insertion depth: 7 cm Catheter type: closed end flexible Catheter size: 20 Guage Catheter at skin depth: 12 cm Test dose: negative and Other  Assessment Events: blood not aspirated, injection not painful, no injection resistance and negative IV test  Additional Notes Informed consent obtained prior to proceeding including risk of failure, 1% risk of PDPH, risk of minor discomfort and bruising.  Discussed rare but serious complications including epidural abscess, permanent nerve injury, epidural hematoma.  Discussed alternatives to epidural analgesia and patient desires to proceed.  Timeout performed pre-procedure verifying patient name, procedure, and platelet count.  Patient tolerated procedure well.

## 2020-05-30 NOTE — Anesthesia Procedure Notes (Signed)
Epidural Patient location during procedure: OB Start time: 05/30/2020 3:18 PM End time: 05/30/2020 3:25 PM  Staffing Anesthesiologist: Mellody Dance, MD Performed: anesthesiologist   Preanesthetic Checklist Completed: patient identified, IV checked, site marked, risks and benefits discussed, monitors and equipment checked, pre-op evaluation and timeout performed  Epidural Patient position: sitting Prep: DuraPrep Patient monitoring: heart rate, cardiac monitor, continuous pulse ox and blood pressure Approach: midline Location: L2-L3 Injection technique: LOR saline  Needle:  Needle type: Tuohy  Needle gauge: 17 G Needle length: 9 cm Needle insertion depth: 7 cm Catheter type: closed end flexible Catheter size: 20 Guage Catheter at skin depth: 12 cm Test dose: negative and Other  Assessment Events: blood not aspirated, injection not painful, no injection resistance and negative IV test  Additional Notes Informed consent obtained prior to proceeding including risk of failure, 1% risk of PDPH, risk of minor discomfort and bruising.  Discussed rare but serious complications including epidural abscess, permanent nerve injury, epidural hematoma.  Discussed alternatives to epidural analgesia and patient desires to proceed.  Timeout performed pre-procedure verifying patient name, procedure, and platelet count.  Patient tolerated procedure well.

## 2020-05-30 NOTE — H&P (Signed)
Dawn Mullins is a 29 y.o. female presenting for IOL at term for borderline oligo. OB History    Gravida  4   Para  1   Term  1   Preterm      AB  2   Living  1     SAB  1   TAB  1   Ectopic      Multiple      Live Births  1          Past Medical History:  Diagnosis Date  . Chlamydia    Neg results 05/15/17  . Medical history non-contributory    Past Surgical History:  Procedure Laterality Date  . DILATION AND CURETTAGE OF UTERUS     Family History: family history includes Breast cancer in her maternal grandmother; Diabetes in her maternal grandfather; Heart attack in her maternal grandfather. Social History:  reports that she has never smoked. She has never used smokeless tobacco. She reports that she does not drink alcohol and does not use drugs.     Maternal Diabetes: No Genetic Screening: Normal Maternal Ultrasounds/Referrals: Normal Fetal Ultrasounds or other Referrals:  None Maternal Substance Abuse:  No Significant Maternal Medications:  None Significant Maternal Lab Results:  Group B Strep negative Other Comments:  None  Review of Systems  Constitutional: Negative.   All other systems reviewed and are negative.  Maternal Medical History:  Reason for admission: Contractions.   Contractions: Onset was less than 1 hour ago.   Frequency: rare.   Perceived severity is mild.    Fetal activity: Perceived fetal activity is normal.   Last perceived fetal movement was within the past hour.    Prenatal complications: Oligohydramnios.   Prenatal Complications - Diabetes: none.      Blood pressure 119/77, pulse 100, temperature 98.6 F (37 C), temperature source Oral, resp. rate 16, height 5\' 5"  (1.651 m), weight 126.5 kg, unknown if currently breastfeeding. Maternal Exam:  Uterine Assessment: Contraction strength is mild.  Contraction frequency is rare.   Abdomen: Patient reports no abdominal tenderness. Fetal presentation:  vertex  Introitus: Normal vulva. Normal vagina.  Ferning test: negative.  Nitrazine test: negative.  Pelvis: adequate for delivery.   Cervix: Cervix evaluated by digital exam.     Physical Exam Vitals and nursing note reviewed. Exam conducted with a chaperone present.  Constitutional:      Appearance: Normal appearance.  HENT:     Head: Normocephalic and atraumatic.  Cardiovascular:     Rate and Rhythm: Normal rate and regular rhythm.     Pulses: Normal pulses.     Heart sounds: Normal heart sounds.  Pulmonary:     Effort: Pulmonary effort is normal.     Breath sounds: Normal breath sounds.  Abdominal:     Palpations: Abdomen is soft.  Genitourinary:    General: Normal vulva.  Musculoskeletal:        General: Normal range of motion.     Cervical back: Normal range of motion and neck supple.  Skin:    General: Skin is warm and dry.  Neurological:     General: No focal deficit present.     Mental Status: She is alert and oriented to person, place, and time.  Psychiatric:        Mood and Affect: Mood normal.        Behavior: Behavior normal.     Prenatal labs: ABO, Rh: B/Positive/-- (04/28 0000) Antibody: Negative (04/28 0000) Rubella: Immune (04/28 0000)  RPR: Nonreactive (04/28 0000)  HBsAg: Negative (04/28 0000)  HIV: Non-reactive (04/28 0000)  GBS: Negative/-- (10/27 0000)   Assessment/Plan: 39 wk IUP Borderline oligo IOL  Rivka Baune J 05/30/2020, 9:23 AM

## 2020-05-30 NOTE — Anesthesia Preprocedure Evaluation (Signed)
Anesthesia Evaluation  Patient identified by MRN, date of birth, ID band Patient awake    Reviewed: Allergy & Precautions, NPO status , Patient's Chart, lab work & pertinent test results  Airway Mallampati: II  TM Distance: >3 FB Neck ROM: Full    Dental no notable dental hx.    Pulmonary neg pulmonary ROS,    Pulmonary exam normal breath sounds clear to auscultation       Cardiovascular negative cardio ROS Normal cardiovascular exam Rhythm:Regular Rate:Normal     Neuro/Psych negative neurological ROS  negative psych ROS   GI/Hepatic negative GI ROS, Neg liver ROS,   Endo/Other  Morbid obesity  Renal/GU negative Renal ROS     Musculoskeletal negative musculoskeletal ROS (+)   Abdominal (+) + obese,   Peds  Hematology negative hematology ROS (+)   Anesthesia Other Findings   Reproductive/Obstetrics (+) Pregnancy                             Anesthesia Physical  Anesthesia Plan  ASA: III  Anesthesia Plan: Epidural   Post-op Pain Management:    Induction:   PONV Risk Score and Plan:   Airway Management Planned:   Additional Equipment:   Intra-op Plan:   Post-operative Plan:   Informed Consent: I have reviewed the patients History and Physical, chart, labs and discussed the procedure including the risks, benefits and alternatives for the proposed anesthesia with the patient or authorized representative who has indicated his/her understanding and acceptance.       Plan Discussed with: Anesthesiologist  Anesthesia Plan Comments:         Anesthesia Quick Evaluation

## 2020-05-30 NOTE — Progress Notes (Signed)
Jurnee Nakayama is a 29 y.o. [id number] at [redacted]w[redacted]d by LMP admitted for induction of labor due to Low amniotic fluid..  Subjective: Comfortable  Objective: BP 130/64   Pulse 81   Temp 98 F (36.7 C) (Oral)   Resp 16   Ht 5\' 5"  (1.651 m)   Wt 126.5 kg   BMI 46.41 kg/m  No intake/output data recorded. No intake/output data recorded.  FHT:  FHR: 145 bpm, variability: moderate,  accelerations:  Present,  decelerations:  Absent UC:   irregular, every 4 minutes SVE:   Dilation: 3 Effacement (%): 70 Station: -2 Exam by:: MD Caidan Hubbert  AROM-clear  Labs: Lab Results  Component Value Date   WBC 11.6 (H) 05/30/2020   HGB 12.2 05/30/2020   HCT 36.7 05/30/2020   MCV 82.7 05/30/2020   PLT 298 05/30/2020    Assessment / Plan: Induction of labor due to oligo,  progressing well on pitocin  Labor: Progressing normally Preeclampsia:  no signs or symptoms of toxicity Fetal Wellbeing:  Category I Pain Control:  Labor support without medications I/D:  n/a Anticipated MOD:  NSVD  Daniell Mancinas J 05/30/2020, 12:32 PM

## 2020-05-31 LAB — CBC
HCT: 33 % — ABNORMAL LOW (ref 36.0–46.0)
Hemoglobin: 11.1 g/dL — ABNORMAL LOW (ref 12.0–15.0)
MCH: 27.8 pg (ref 26.0–34.0)
MCHC: 33.6 g/dL (ref 30.0–36.0)
MCV: 82.7 fL (ref 80.0–100.0)
Platelets: 297 10*3/uL (ref 150–400)
RBC: 3.99 MIL/uL (ref 3.87–5.11)
RDW: 13.4 % (ref 11.5–15.5)
WBC: 18.8 10*3/uL — ABNORMAL HIGH (ref 4.0–10.5)
nRBC: 0 % (ref 0.0–0.2)

## 2020-05-31 NOTE — Anesthesia Postprocedure Evaluation (Signed)
Anesthesia Post Note  Patient: Dawn Mullins  Procedure(s) Performed: AN AD HOC LABOR EPIDURAL     Patient location during evaluation: Mother Baby Anesthesia Type: Epidural Level of consciousness: awake and alert Pain management: pain level not controlled Vital Signs Assessment: post-procedure vital signs reviewed and stable Respiratory status: spontaneous breathing, nonlabored ventilation and respiratory function stable Cardiovascular status: stable Postop Assessment: no headache, no backache, no apparent nausea or vomiting, able to ambulate and adequate PO intake Anesthetic complications: no Comments: Pain not totally controlled per patient. Initial placement and subsequent readjustment of the catheter did not relieve the pain   No complications documented.  Last Vitals:  Vitals:   05/31/20 0220 05/31/20 0620  BP: 117/67 110/60  Pulse: 73 73  Resp: 16 16  Temp: 36.7 C 36.7 C  SpO2: 100% 98%    Last Pain:  Vitals:   05/31/20 0620  TempSrc: Oral  PainSc: 0-No pain   Pain Goal:                   Blythe Stanford

## 2020-05-31 NOTE — Progress Notes (Signed)
PPD # 1 S/P NSVD  Live born female  Birth Weight: 7 lb 6.2 oz (3351 g) APGAR: 8, 9  Newborn Delivery   Birth date/time: 05/30/2020 19:19:00 Delivery type: Vaginal, Spontaneous     Baby name: Dawn Mullins Delivering provider: Olivia Mackie   Episiotomy:None   Lacerations:2nd degree;Perineal   Feeding: breast  Pain control at delivery: Epidural   Subjective   Reports feeling well with no complaints.             Tolerating po/ No nausea or vomiting             Bleeding is moderate             Pain controlled with ibuprofen (OTC)             Up ad lib / ambulatory / voiding without difficulties   Objective   A & O x 3, in no apparent distress              VS:  Vitals:   05/30/20 2115 05/30/20 2215 05/31/20 0220 05/31/20 0620  BP: 127/79 131/72 117/67 110/60  Pulse: 93 97 73 73  Resp: 16 16 16 16   Temp: 98 F (36.7 C) 98 F (36.7 C) 98.1 F (36.7 C) 98.1 F (36.7 C)  TempSrc: Oral Oral Oral Oral  SpO2: 100% 100% 100% 98%  Weight:      Height:        LABS:  Recent Labs    05/30/20 0915 05/31/20 0550  WBC 11.6* 18.8*  HGB 12.2 11.1*  HCT 36.7 33.0*  PLT 298 297     Blood type: --/--/B POS (11/21 0915)  Rubella: Immune (04/28 0000)   Vaccines:   TDaP   Declined                   Flu       Declined                             COVID-19 Declined   Gen: AAO x 3, NAD  Abdomen: soft, non-tender, non-distended             Fundus: firm, non-tender, U-1  Perineum: repair intact, no edema  Lochia: moderate  Extremities: no edema, no calf pain or tenderness   Assessment/Plan PPD # 1 29 y.o., 06-26-1972   Principal Problem:   Postpartum care following vaginal delivery 11/21 Active Problems:   Encounter for induction of labor   SVD (spontaneous vaginal delivery) 11/21   Second degree perineal laceration 11/21   Doing well - stable status  Routine post partum orders  Anticipate discharge tomorrow   12/21, MSN, CNM 05/31/2020, 11:11 AM

## 2020-06-01 MED ORDER — IBUPROFEN 600 MG PO TABS: 600.0000 mg | ORAL_TABLET | Freq: Four times a day (QID) | ORAL | 0 refills | Status: DC

## 2020-06-01 MED ORDER — BENZOCAINE-MENTHOL 20-0.5 % EX AERO: 1.0000 "application " | INHALATION_SPRAY | CUTANEOUS | Status: DC | PRN

## 2020-06-01 MED ORDER — COCONUT OIL OIL: 1.0000 "application " | TOPICAL_OIL | 0 refills | Status: DC | PRN

## 2020-06-01 NOTE — Discharge Summary (Signed)
Postpartum Discharge Summary  Date of Service updated 06/01/2020     Patient Name: Dawn Mullins DOB: 02-28-1991 MRN: [id number]  Date of admission: 05/30/2020 Delivery date:05/30/2020  Delivering provider: Brien Few  Date of discharge: 06/01/2020  Admitting diagnosis: Encounter for induction of labor [Z34.90] Intrauterine pregnancy: [redacted]w[redacted]d    Secondary diagnosis:  Principal Problem:   Postpartum care following vaginal delivery 11/21 Active Problems:   Encounter for induction of labor   SVD (spontaneous vaginal delivery) 11/21   Second degree perineal laceration 11/21  Additional problems: none    Discharge diagnosis: Term Pregnancy Delivered                                              Post partum procedures:none Augmentation: AROM and Pitocin Complications: None  Hospital course: Induction of Labor With Vaginal Delivery   29y.o. yo [id number] 31w6das admitted to the hospital 05/30/2020 for induction of labor.  Indication for induction: Oligohydramnios.  Patient had an uncomplicated labor course as follows: Membrane Rupture Time/Date: 12:27 PM ,05/30/2020   Delivery Method:Vaginal, Spontaneous  Episiotomy: None  Lacerations:  2nd degree;Perineal  Details of delivery can be found in separate delivery note.  Patient had a routine postpartum course. Patient is discharged home 06/01/20.  Newborn Data: Birth date:05/30/2020  Birth time:7:19 PM  Gender:Female  Living status:Living  Apgars:8 ,9  Weight:3351 g   Magnesium Sulfate received: No BMZ received: No Rhophylac:N/A MMR:No T-DaP:declines Flu: No  Covid: declines Transfusion:No  Physical exam  Vitals:   05/31/20 1120 05/31/20 1434 05/31/20 2211 06/01/20 0525  BP: 98/65 123/76 (!) 104/59 115/71  Pulse: 79 75 73 72  Resp: _0 Temp: 97.6 F (36.4 C) 97.7 F (36.5 C) 97.8 F (36.6 C) 97.7 F (36.5 C)  TempSrc: Oral Oral Oral Oral  SpO2:  100% 99% 97%  Weight:      Height:        General: alert, cooperative and no distress  Heart: RRR Lungs: clear, equal bilaterally  Lochia: appropriate Uterine Fundus: firm, below umbilicus  perineum: well approximated, mild edema and erythema DVT Evaluation: No evidence of DVT seen on physical exam. No significant calf/ankle edema. Labs: Lab Results  Component Value Date   WBC 18.8 (H) 05/31/2020   HGB 11.1 (L) 05/31/2020   HCT 33.0 (L) 05/31/2020   MCV 82.7 05/31/2020   PLT 297 05/31/2020   No flowsheet data found. Edinburgh Score: Edinburgh Postnatal Depression Scale Screening Tool 05/31/2020  I have been able to laugh and see the funny side of things. 0  I have looked forward with enjoyment to things. 0  I have blamed myself unnecessarily when things went wrong. 0  I have been anxious or worried for no good reason. 0  I have felt scared or panicky for no good reason. 0  Things have been getting on top of me. 0  I have been so unhappy that I have had difficulty sleeping. 0  I have felt sad or miserable. 0  I have been so unhappy that I have been crying. 0  The thought of harming myself has occurred to me. 0  Edinburgh Postnatal Depression Scale Total 0      After visit meds:  Allergies as of 06/01/2020   No Known Allergies     Medication List  TAKE these medications   acetaminophen 325 MG tablet Commonly known as: Tylenol Take 2 tablets (650 mg total) by mouth every 4 (four) hours as needed (for pain scale < 4).   benzocaine-Menthol 20-0.5 % Aero Commonly known as: DERMOPLAST Apply 1 application topically as needed for irritation (perineal discomfort).   coconut oil Oil Apply 1 application topically as needed.   ibuprofen 600 MG tablet Commonly known as: ADVIL Take 1 tablet (600 mg total) by mouth every 6 (six) hours.   prenatal multivitamin Tabs tablet Take 1 tablet by mouth daily at 12 noon.            Discharge Care Instructions  (From admission, onward)         Start      Ordered   06/01/20 0000  Discharge wound care:       Comments: Warm water sitz baths as needed   06/01/20 1444           Discharge home in stable condition Infant Feeding: Breast Infant Disposition:home with mother Discharge instruction: per After Visit Summary and Postpartum booklet. Activity: Advance as tolerated. Pelvic rest for 6 weeks.  Diet: low salt diet Anticipated Birth Control: Unsure Postpartum Appointment:6 weeks Additional Postpartum F/U: Postpartum Depression checkup Future Appointments:No future appointments. Follow up Visit:  Follow-up Information    Brien Few, MD. Schedule an appointment as soon as possible for a visit in 6 week(s).   Specialty: Obstetrics and Gynecology Why: Postpartum visit  Contact information: [place] [id number] [phone]                   06/01/2020 Darliss Cheney, CNM

## 2022-07-10 NOTE — L&D Delivery Note (Signed)
Delivery Note At 4:25 PM a viable and healthy female was delivered via Vaginal, Spontaneous (Presentation: Right Occiput Posterior).  APGAR: 9, 9; weight 6 lb 14.4 oz (3130 g).   Placenta status: Spontaneous, Intact.  Cord: 3 vessels with the following complications: None.  Cord pH: na  Anesthesia: Epidural Episiotomy: None Lacerations: 2nd degree;Vaginal Suture Repair: 2.0 vicryl rapide Est. Blood Loss (mL): 57  Mom to postpartum.  Baby to Couplet care / Skin to Skin.  Monque Haggar J 04/24/2023, 6:10 PM

## 2022-10-10 DIAGNOSIS — Z3689 Encounter for other specified antenatal screening: Secondary | ICD-10-CM | POA: Diagnosis not present

## 2022-10-10 DIAGNOSIS — Z3490 Encounter for supervision of normal pregnancy, unspecified, unspecified trimester: Secondary | ICD-10-CM | POA: Diagnosis not present

## 2022-10-10 DIAGNOSIS — Z3200 Encounter for pregnancy test, result unknown: Secondary | ICD-10-CM | POA: Diagnosis not present

## 2022-10-12 DIAGNOSIS — Z3201 Encounter for pregnancy test, result positive: Secondary | ICD-10-CM | POA: Diagnosis not present

## 2022-10-19 DIAGNOSIS — Z3481 Encounter for supervision of other normal pregnancy, first trimester: Secondary | ICD-10-CM | POA: Diagnosis not present

## 2022-10-19 DIAGNOSIS — Z36 Encounter for antenatal screening for chromosomal anomalies: Secondary | ICD-10-CM | POA: Diagnosis not present

## 2022-10-19 DIAGNOSIS — Z3689 Encounter for other specified antenatal screening: Secondary | ICD-10-CM | POA: Diagnosis not present

## 2022-10-19 LAB — OB RESULTS CONSOLE HIV ANTIBODY (ROUTINE TESTING): HIV: NONREACTIVE

## 2022-10-19 LAB — OB RESULTS CONSOLE RPR: RPR: NONREACTIVE

## 2022-10-19 LAB — OB RESULTS CONSOLE RUBELLA ANTIBODY, IGM: Rubella: NON-IMMUNE/NOT IMMUNE

## 2022-10-19 LAB — OB RESULTS CONSOLE HEPATITIS B SURFACE ANTIGEN: Hepatitis B Surface Ag: NEGATIVE

## 2022-11-16 DIAGNOSIS — Z361 Encounter for antenatal screening for raised alphafetoprotein level: Secondary | ICD-10-CM | POA: Diagnosis not present

## 2022-11-16 DIAGNOSIS — Z118 Encounter for screening for other infectious and parasitic diseases: Secondary | ICD-10-CM | POA: Diagnosis not present

## 2022-12-14 DIAGNOSIS — Z363 Encounter for antenatal screening for malformations: Secondary | ICD-10-CM | POA: Diagnosis not present

## 2023-01-04 DIAGNOSIS — Z362 Encounter for other antenatal screening follow-up: Secondary | ICD-10-CM | POA: Diagnosis not present

## 2023-02-01 DIAGNOSIS — Z3689 Encounter for other specified antenatal screening: Secondary | ICD-10-CM | POA: Diagnosis not present

## 2023-02-15 DIAGNOSIS — Z3689 Encounter for other specified antenatal screening: Secondary | ICD-10-CM | POA: Diagnosis not present

## 2023-02-15 DIAGNOSIS — O99213 Obesity complicating pregnancy, third trimester: Secondary | ICD-10-CM | POA: Diagnosis not present

## 2023-02-15 DIAGNOSIS — Z3A28 28 weeks gestation of pregnancy: Secondary | ICD-10-CM | POA: Diagnosis not present

## 2023-03-15 DIAGNOSIS — O403XX0 Polyhydramnios, third trimester, not applicable or unspecified: Secondary | ICD-10-CM | POA: Diagnosis not present

## 2023-03-15 DIAGNOSIS — Z3A32 32 weeks gestation of pregnancy: Secondary | ICD-10-CM | POA: Diagnosis not present

## 2023-04-10 DIAGNOSIS — Z3483 Encounter for supervision of other normal pregnancy, third trimester: Secondary | ICD-10-CM | POA: Diagnosis not present

## 2023-04-10 DIAGNOSIS — Z3482 Encounter for supervision of other normal pregnancy, second trimester: Secondary | ICD-10-CM | POA: Diagnosis not present

## 2023-04-12 DIAGNOSIS — Z3493 Encounter for supervision of normal pregnancy, unspecified, third trimester: Secondary | ICD-10-CM | POA: Diagnosis not present

## 2023-04-12 DIAGNOSIS — Z3A36 36 weeks gestation of pregnancy: Secondary | ICD-10-CM | POA: Diagnosis not present

## 2023-04-12 LAB — OB RESULTS CONSOLE GBS: GBS: NEGATIVE

## 2023-04-15 DIAGNOSIS — Z3483 Encounter for supervision of other normal pregnancy, third trimester: Secondary | ICD-10-CM | POA: Diagnosis not present

## 2023-04-15 DIAGNOSIS — Z3482 Encounter for supervision of other normal pregnancy, second trimester: Secondary | ICD-10-CM | POA: Diagnosis not present

## 2023-04-17 ENCOUNTER — Encounter (HOSPITAL_COMMUNITY): Payer: Self-pay | Admitting: *Deleted

## 2023-04-17 ENCOUNTER — Encounter (HOSPITAL_COMMUNITY): Payer: Self-pay

## 2023-04-17 ENCOUNTER — Telehealth (HOSPITAL_COMMUNITY): Payer: Self-pay | Admitting: *Deleted

## 2023-04-17 NOTE — Telephone Encounter (Signed)
Preadmission screen  

## 2023-04-18 ENCOUNTER — Other Ambulatory Visit: Payer: Self-pay | Admitting: Obstetrics and Gynecology

## 2023-04-19 ENCOUNTER — Encounter (HOSPITAL_COMMUNITY): Payer: Self-pay | Admitting: *Deleted

## 2023-04-19 ENCOUNTER — Telehealth (HOSPITAL_COMMUNITY): Payer: Self-pay | Admitting: *Deleted

## 2023-04-19 DIAGNOSIS — O133 Gestational [pregnancy-induced] hypertension without significant proteinuria, third trimester: Secondary | ICD-10-CM | POA: Diagnosis not present

## 2023-04-19 DIAGNOSIS — Z3A37 37 weeks gestation of pregnancy: Secondary | ICD-10-CM | POA: Diagnosis not present

## 2023-04-19 NOTE — Telephone Encounter (Signed)
Preadmission screen  

## 2023-04-24 ENCOUNTER — Other Ambulatory Visit: Payer: Self-pay

## 2023-04-24 ENCOUNTER — Inpatient Hospital Stay (HOSPITAL_COMMUNITY)
Admission: RE | Admit: 2023-04-24 | Discharge: 2023-04-26 | DRG: 807 | Disposition: A | Discharge status: Home / Self Care | Payer: 59 | Attending: Obstetrics and Gynecology | Admitting: Obstetrics and Gynecology

## 2023-04-24 ENCOUNTER — Inpatient Hospital Stay (HOSPITAL_COMMUNITY): Payer: 59 | Admitting: Anesthesiology

## 2023-04-24 ENCOUNTER — Inpatient Hospital Stay (HOSPITAL_COMMUNITY): Payer: 59

## 2023-04-24 ENCOUNTER — Encounter (HOSPITAL_COMMUNITY): Payer: Self-pay | Admitting: Obstetrics and Gynecology

## 2023-04-24 DIAGNOSIS — O134 Gestational [pregnancy-induced] hypertension without significant proteinuria, complicating childbirth: Principal | ICD-10-CM | POA: Diagnosis present

## 2023-04-24 DIAGNOSIS — Z8249 Family history of ischemic heart disease and other diseases of the circulatory system: Secondary | ICD-10-CM | POA: Diagnosis not present

## 2023-04-24 DIAGNOSIS — Z803 Family history of malignant neoplasm of breast: Secondary | ICD-10-CM | POA: Diagnosis not present

## 2023-04-24 DIAGNOSIS — Z3A38 38 weeks gestation of pregnancy: Secondary | ICD-10-CM

## 2023-04-24 DIAGNOSIS — Z833 Family history of diabetes mellitus: Secondary | ICD-10-CM | POA: Diagnosis not present

## 2023-04-24 DIAGNOSIS — Z23 Encounter for immunization: Secondary | ICD-10-CM | POA: Diagnosis not present

## 2023-04-24 DIAGNOSIS — Z3490 Encounter for supervision of normal pregnancy, unspecified, unspecified trimester: Principal | ICD-10-CM | POA: Diagnosis present

## 2023-04-24 DIAGNOSIS — O139 Gestational [pregnancy-induced] hypertension without significant proteinuria, unspecified trimester: Secondary | ICD-10-CM | POA: Diagnosis present

## 2023-04-24 LAB — COMPREHENSIVE METABOLIC PANEL
ALT: 9 U/L (ref 0–44)
AST: 14 U/L — ABNORMAL LOW (ref 15–41)
Albumin: 3 g/dL — ABNORMAL LOW (ref 3.5–5.0)
Alkaline Phosphatase: 82 U/L (ref 38–126)
Anion gap: 12 (ref 5–15)
BUN: 7 mg/dL (ref 6–20)
CO2: 19 mmol/L — ABNORMAL LOW (ref 22–32)
Calcium: 9.8 mg/dL (ref 8.9–10.3)
Chloride: 106 mmol/L (ref 98–111)
Creatinine, Ser: 0.67 mg/dL (ref 0.44–1.00)
GFR, Estimated: >60 mL/min (ref >60)
Glucose, Bld: 81 mg/dL (ref 70–99)
Potassium: 3.6 mmol/L (ref 3.5–5.1)
Sodium: 137 mmol/L (ref 135–145)
Total Bilirubin: 0.7 mg/dL (ref 0.3–1.2)
Total Protein: 6.3 g/dL — ABNORMAL LOW (ref 6.5–8.1)

## 2023-04-24 LAB — CBC
HCT: 35.1 % — ABNORMAL LOW (ref 36.0–46.0)
Hemoglobin: 11.7 g/dL — ABNORMAL LOW (ref 12.0–15.0)
MCH: 27.3 pg (ref 26.0–34.0)
MCHC: 33.3 g/dL (ref 30.0–36.0)
MCV: 81.8 fL (ref 80.0–100.0)
Platelets: 288 10*3/uL (ref 150–400)
RBC: 4.29 MIL/uL (ref 3.87–5.11)
RDW: 13 % (ref 11.5–15.5)
WBC: 11.8 10*3/uL — ABNORMAL HIGH (ref 4.0–10.5)
nRBC: 0 % (ref 0.0–0.2)

## 2023-04-24 LAB — TYPE AND SCREEN
ABO/RH(D): B POS
Antibody Screen: NEGATIVE

## 2023-04-24 LAB — RPR: RPR Ser Ql: NONREACTIVE

## 2023-04-24 MED ORDER — DIBUCAINE (PERIANAL) 1 % EX OINT: 1.0000 | TOPICAL_OINTMENT | CUTANEOUS | Status: DC | PRN

## 2023-04-24 MED ORDER — LIDOCAINE HCL (PF) 1 % IJ SOLN: 30.0000 mL | INTRAMUSCULAR | Status: DC | PRN

## 2023-04-24 MED ORDER — LIDOCAINE HCL (PF) 1 % IJ SOLN
INTRAMUSCULAR | Status: DC | PRN
  Administered 2023-04-24: 8 mL via EPIDURAL

## 2023-04-24 MED ORDER — ACETAMINOPHEN 325 MG PO TABS: 650.0000 mg | ORAL_TABLET | ORAL | Status: DC | PRN

## 2023-04-24 MED ORDER — OXYTOCIN-SODIUM CHLORIDE 30-0.9 UT/500ML-% IV SOLN: 2.5000 [IU]/h | INTRAVENOUS | Status: DC

## 2023-04-24 MED ORDER — METHYLERGONOVINE MALEATE 0.2 MG/ML IJ SOLN: 0.2000 mg | INTRAMUSCULAR | Status: DC | PRN

## 2023-04-24 MED ORDER — ZOLPIDEM TARTRATE 5 MG PO TABS: 5.0000 mg | ORAL_TABLET | Freq: Every evening | ORAL | Status: DC | PRN

## 2023-04-24 MED ORDER — LACTATED RINGERS IV SOLN: INTRAVENOUS | Status: DC

## 2023-04-24 MED ORDER — WITCH HAZEL-GLYCERIN EX PADS: 1.0000 | MEDICATED_PAD | CUTANEOUS | Status: DC | PRN

## 2023-04-24 MED ORDER — EPHEDRINE 5 MG/ML INJ: 10.0000 mg | INTRAVENOUS | Status: DC | PRN

## 2023-04-24 MED ORDER — LACTATED RINGERS IV SOLN: 500.0000 mL | Freq: Once | INTRAVENOUS | Status: DC

## 2023-04-24 MED ORDER — SENNOSIDES-DOCUSATE SODIUM 8.6-50 MG PO TABS
2.0000 | ORAL_TABLET | Freq: Every day | ORAL | Status: DC
  Administered 2023-04-25: 2 via ORAL
  Filled 2023-04-24 (×2): qty 2

## 2023-04-24 MED ORDER — BENZOCAINE-MENTHOL 20-0.5 % EX AERO: 1.0000 | INHALATION_SPRAY | CUTANEOUS | Status: DC | PRN

## 2023-04-24 MED ORDER — TERBUTALINE SULFATE 1 MG/ML IJ SOLN: 0.2500 mg | Freq: Once | INTRAMUSCULAR | Status: DC | PRN

## 2023-04-24 MED ORDER — IBUPROFEN 600 MG PO TABS
600.0000 mg | ORAL_TABLET | Freq: Four times a day (QID) | ORAL | Status: DC
  Administered 2023-04-24 – 2023-04-25 (×3): 600 mg via ORAL
  Filled 2023-04-24 (×9): qty 1

## 2023-04-24 MED ORDER — OXYTOCIN-SODIUM CHLORIDE 30-0.9 UT/500ML-% IV SOLN
1.0000 m[IU]/min | INTRAVENOUS | Status: DC
  Administered 2023-04-24: 2 m[IU]/min via INTRAVENOUS
  Filled 2023-04-24: qty 500

## 2023-04-24 MED ORDER — ONDANSETRON HCL 4 MG/2ML IJ SOLN: 4.0000 mg | INTRAMUSCULAR | Status: DC | PRN

## 2023-04-24 MED ORDER — TETANUS-DIPHTH-ACELL PERTUSSIS 5-2.5-18.5 LF-MCG/0.5 IM SUSY: 0.5000 mL | PREFILLED_SYRINGE | Freq: Once | INTRAMUSCULAR | Status: DC

## 2023-04-24 MED ORDER — PRENATAL MULTIVITAMIN CH
1.0000 | ORAL_TABLET | Freq: Every day | ORAL | Status: DC
  Administered 2023-04-25 – 2023-04-26 (×2): 1 via ORAL
  Filled 2023-04-24 (×2): qty 1

## 2023-04-24 MED ORDER — SOD CITRATE-CITRIC ACID 500-334 MG/5ML PO SOLN: 30.0000 mL | ORAL | Status: DC | PRN

## 2023-04-24 MED ORDER — METHYLERGONOVINE MALEATE 0.2 MG PO TABS: 0.2000 mg | ORAL_TABLET | ORAL | Status: DC | PRN

## 2023-04-24 MED ORDER — SIMETHICONE 80 MG PO CHEW: 80.0000 mg | CHEWABLE_TABLET | ORAL | Status: DC | PRN

## 2023-04-24 MED ORDER — COCONUT OIL OIL: 1.0000 | TOPICAL_OIL | Status: DC | PRN

## 2023-04-24 MED ORDER — ONDANSETRON HCL 4 MG/2ML IJ SOLN: 4.0000 mg | Freq: Four times a day (QID) | INTRAMUSCULAR | Status: DC | PRN

## 2023-04-24 MED ORDER — DIPHENHYDRAMINE HCL 50 MG/ML IJ SOLN: 12.5000 mg | INTRAMUSCULAR | Status: DC | PRN

## 2023-04-24 MED ORDER — PHENYLEPHRINE 80 MCG/ML (10ML) SYRINGE FOR IV PUSH (FOR BLOOD PRESSURE SUPPORT)
80.0000 ug | PREFILLED_SYRINGE | INTRAVENOUS | Status: DC | PRN
  Filled 2023-04-24: qty 10

## 2023-04-24 MED ORDER — ONDANSETRON HCL 4 MG PO TABS: 4.0000 mg | ORAL_TABLET | ORAL | Status: DC | PRN

## 2023-04-24 MED ORDER — LACTATED RINGERS IV SOLN: 500.0000 mL | INTRAVENOUS | Status: DC | PRN

## 2023-04-24 MED ORDER — FENTANYL-BUPIVACAINE-NACL 0.5-0.125-0.9 MG/250ML-% EP SOLN
12.0000 mL/h | EPIDURAL | Status: DC | PRN
  Administered 2023-04-24: 12 mL/h via EPIDURAL
  Filled 2023-04-24: qty 250

## 2023-04-24 MED ORDER — DIPHENHYDRAMINE HCL 25 MG PO CAPS: 25.0000 mg | ORAL_CAPSULE | Freq: Four times a day (QID) | ORAL | Status: DC | PRN

## 2023-04-24 MED ORDER — OXYTOCIN BOLUS FROM INFUSION
333.0000 mL | Freq: Once | INTRAVENOUS | Status: AC
  Administered 2023-04-24: 333 mL via INTRAVENOUS

## 2023-04-24 MED ORDER — PHENYLEPHRINE 80 MCG/ML (10ML) SYRINGE FOR IV PUSH (FOR BLOOD PRESSURE SUPPORT)
80.0000 ug | PREFILLED_SYRINGE | INTRAVENOUS | Status: DC | PRN
  Administered 2023-04-24 (×2): 80 ug via INTRAVENOUS

## 2023-04-24 NOTE — Progress Notes (Signed)
Dawn Mullins is a 32 y.o. [id number] at [redacted]w[redacted]d by LMP admitted for induction of labor due to Hypertension.  Subjective: crampy  Objective: BP 117/67   Pulse 67   Temp 98.8 F (37.1 C) (Oral)   Resp 18   Ht 5\' 4"  (1.626 m)   Wt 124.3 kg   BMI 47.05 kg/m  No intake/output data recorded. No intake/output data recorded.  FHT:  FHR: 145 bpm, variability: moderate,  accelerations:  Present,  decelerations:  Absent UC:   rare SVE:   3/60/-2 aROm attempted with minimal fluid return Labs: Lab Results  Component Value Date   WBC 11.8 (H) 04/24/2023   HGB 11.7 (L) 04/24/2023   HCT 35.1 (L) 04/24/2023   MCV 81.8 04/24/2023   PLT 288 04/24/2023    Assessment / Plan: Induction of labor due to gestational hypertension,  progressing well on pitocin  Labor: Progressing normally Preeclampsia:  no signs or symptoms of toxicity Fetal Wellbeing:  Category I Pain Control:  Labor support without medications I/D:  n/a Anticipated MOD:  NSVD  Lenoard Aden, MD 04/24/2023, 8:22 AM

## 2023-04-24 NOTE — Lactation Note (Signed)
This note was copied from a baby's chart. Lactation Consultation Note  Patient Name: Dawn Mullins [id number] Date: 04/24/2023 Age:34 hours Reason for consult: Initial assessment;Early term 39-38.6wks Mom stated this baby is BF great. Experienced BF mom stated she isn't having any trouble BF this baby. Denies painful latching. Newborn feeding habits, STS, I&O, reviewed. Mom encouraged to feed baby 8-12 times/24 hours and with feeding cues.  Encouraged mom to call for assistance as needed.   Maternal Data Does the patient have breastfeeding experience prior to this delivery?: Yes How long did the patient breastfeed?: 6 months to her now 34 yr old and 1 month to her now 34 yr old  Feeding    LATCH Score Latch:  (LC didn't see latch)     Type of Nipple: Everted at rest and after stimulation  Comfort (Breast/Nipple): Soft / non-tender  Hold (Positioning): No assistance needed to correctly position infant at breast.      Lactation Tools Discussed/Used    Interventions Interventions: Breast feeding basics reviewed;Skin to skin;Education;LC Services brochure  Discharge    Consult Status Consult Status: Follow-up Date: 04/25/23 Follow-up type: In-patient    Charyl Dancer 04/24/2023, 11:12 PM

## 2023-04-24 NOTE — Progress Notes (Signed)
Dawn Mullins is a 32 y.o. [id number] at [redacted]w[redacted]d by LMP admitted for induction of labor due to Hypertension.  Subjective: crampy  Objective: BP 110/65   Pulse (!) 56   Temp 98.8 F (37.1 C) (Axillary)   Resp 16   Ht 5\' 4"  (1.626 m)   Wt 124.3 kg   SpO2 99%   BMI 47.05 kg/m  No intake/output data recorded. No intake/output data recorded.  FHT:  FHR: 145 bpm, variability: moderate,  accelerations:  Present,  decelerations:  Absent UC:   every 2-13min SVE: 6/70/-2 Labs: Lab Results  Component Value Date   WBC 11.8 (H) 04/24/2023   HGB 11.7 (L) 04/24/2023   HCT 35.1 (L) 04/24/2023   MCV 81.8 04/24/2023   PLT 288 04/24/2023    Assessment / Plan: Induction of labor due to gestational hypertension,  progressing well on pitocin  Labor: Progressing normally Preeclampsia:  no signs or symptoms of toxicity Fetal Wellbeing:  Category I Pain Control:  epidural I/D:  n/a Anticipated MOD:  NSVD  Lenoard Aden, MD 04/24/2023, 1:39 PM

## 2023-04-24 NOTE — H&P (Signed)
Dawn Mullins is a 32 y.o. female presenting for IOL for gest htn and  BMI > 40 prepregnancy on LDASA. SVD x2. OB History     Gravida  5   Para  2   Term  2   Preterm      AB  2   Living  2      SAB  1   IAB  1   Ectopic      Multiple  0   Live Births  2          Past Medical History:  Diagnosis Date   Chlamydia    Neg results 05/15/17   Medical history non-contributory    Past Surgical History:  Procedure Laterality Date   DILATION AND CURETTAGE OF UTERUS     Family History: family history includes Breast cancer in her maternal grandmother; Diabetes in her maternal grandfather; Heart attack in her maternal grandfather. Social History:  reports that she has never smoked. She has never used smokeless tobacco. She reports that she does not drink alcohol and does not use drugs.     Maternal Diabetes: No Genetic Screening: Normal Maternal Ultrasounds/Referrals: Normal Fetal Ultrasounds or other Referrals:  None Maternal Substance Abuse:  No Significant Maternal Medications:  None Significant Maternal Lab Results:  Group B Strep negative Number of Prenatal Visits:greater than 3 verified prenatal visits Maternal Vaccinations:TDap Other Comments:  None  Review of Systems  Constitutional: Negative.   All other systems reviewed and are negative.  Maternal Medical History:  Reason for admission: Contractions.   Contractions: Frequency: rare.   Perceived severity is mild.   Fetal activity: Perceived fetal activity is normal.   Last perceived fetal movement was within the past hour.   Prenatal complications: PIH.   Prenatal Complications - Diabetes: none.     unknown if currently breastfeeding. Maternal Exam:  Uterine Assessment: Contraction strength is mild.  Contraction frequency is irregular.  Abdomen: Patient reports no abdominal tenderness. Fetal presentation: vertex Introitus: Normal vulva. Normal vagina.  Ferning test: not done.   Nitrazine test: not done. Pelvis: adequate for delivery.   Cervix: Cervix evaluated by digital exam.     Physical Exam Constitutional:      Appearance: Normal appearance. She is obese.  HENT:     Head: Normocephalic and atraumatic.  Cardiovascular:     Rate and Rhythm: Normal rate and regular rhythm.     Pulses: Normal pulses.     Heart sounds: Normal heart sounds.  Pulmonary:     Effort: Pulmonary effort is normal.     Breath sounds: Normal breath sounds.  Abdominal:     General: Bowel sounds are normal.     Palpations: Abdomen is soft.  Genitourinary:    General: Normal vulva.  Musculoskeletal:        General: Normal range of motion.     Cervical back: Normal range of motion.  Skin:    General: Skin is warm and dry.  Neurological:     General: No focal deficit present.     Mental Status: She is alert and oriented to person, place, and time.  Psychiatric:        Mood and Affect: Mood normal.        Behavior: Behavior normal.     Prenatal labs: ABO, Rh:   Antibody:   Rubella: Nonimmune (04/11 0000) RPR: Nonreactive (04/11 0000)  HBsAg: Negative (04/11 0000)  HIV: Non-reactive (04/11 0000)  GBS: Negative/-- (10/03 0000)   Assessment/Plan:  37 wk IUP Gest HTN- no s/s PEC BMI>40 IOL   Taquila Leys J 04/24/2023, 6:27 AM

## 2023-04-24 NOTE — Anesthesia Postprocedure Evaluation (Signed)
Anesthesia Post Note  Patient: Dawn Mullins  Procedure(s) Performed: AN AD HOC LABOR EPIDURAL     Patient location during evaluation: Mother Baby Anesthesia Type: Epidural Level of consciousness: awake and alert Pain management: pain level controlled Vital Signs Assessment: post-procedure vital signs reviewed and stable Respiratory status: spontaneous breathing, nonlabored ventilation and respiratory function stable Cardiovascular status: stable Postop Assessment: no headache, no backache and epidural receding Anesthetic complications: no   No notable events documented.  Last Vitals:  Vitals:   04/24/23 1820 04/24/23 1945  BP: (!) 127/59 121/78  Pulse: 80 90  Resp: 20 20  Temp: 36.8 C   SpO2: 100% 99%    Last Pain:  Vitals:   04/24/23 1945  TempSrc:   PainSc: 0-No pain                 Jazmene Racz

## 2023-04-24 NOTE — Anesthesia Preprocedure Evaluation (Signed)
Anesthesia Evaluation  Patient identified by MRN, date of birth, ID band Patient awake    Reviewed: Allergy & Precautions, H&P , NPO status , Patient's Chart, lab work & pertinent test results, reviewed documented beta blocker date and time   Airway Mallampati: II  TM Distance: >3 FB Neck ROM: full    Dental no notable dental hx. (+) Teeth Intact, Dental Advisory Given   Pulmonary neg pulmonary ROS   Pulmonary exam normal breath sounds clear to auscultation       Cardiovascular negative cardio ROS Normal cardiovascular exam Rhythm:regular Rate:Normal     Neuro/Psych negative neurological ROS  negative psych ROS   GI/Hepatic negative GI ROS, Neg liver ROS,,,  Endo/Other  negative endocrine ROS    Renal/GU negative Renal ROS  negative genitourinary   Musculoskeletal   Abdominal   Peds  Hematology negative hematology ROS (+)   Anesthesia Other Findings   Reproductive/Obstetrics (+) Pregnancy                             Anesthesia Physical Anesthesia Plan  ASA: 3  Anesthesia Plan: Epidural   Post-op Pain Management: Minimal or no pain anticipated   Induction:   PONV Risk Score and Plan: 2  Airway Management Planned: Natural Airway and Simple Face Mask  Additional Equipment: None  Intra-op Plan:   Post-operative Plan:   Informed Consent: I have reviewed the patients History and Physical, chart, labs and discussed the procedure including the risks, benefits and alternatives for the proposed anesthesia with the patient or authorized representative who has indicated his/her understanding and acceptance.     Dental Advisory Given  Plan Discussed with: Anesthesiologist  Anesthesia Plan Comments: (Labs checked- platelets confirmed with RN in room. Fetal heart tracing, per RN, reported to be stable enough for sitting procedure. Discussed epidural, and patient consents to the  procedure:  included risk of possible headache,backache, failed block, allergic reaction, and nerve injury. This patient was asked if she had any questions or concerns before the procedure started.)        Anesthesia Quick Evaluation

## 2023-04-24 NOTE — Progress Notes (Signed)
Plan of care reviewed with pt, instructed to call for assistance to bathroom, family at bedside at this time.

## 2023-04-24 NOTE — Anesthesia Procedure Notes (Signed)
Epidural Patient location during procedure: OB Start time: 04/24/2023 12:17 PM End time: 04/24/2023 12:20 PM  Staffing Anesthesiologist: Bethena Midget, MD  Preanesthetic Checklist Completed: patient identified, IV checked, site marked, risks and benefits discussed, surgical consent, monitors and equipment checked, pre-op evaluation and timeout performed  Epidural Patient position: sitting Prep: DuraPrep and site prepped and draped Patient monitoring: continuous pulse ox and blood pressure Approach: midline Location: L2-L3 Injection technique: LOR air  Needle:  Needle type: Tuohy  Needle gauge: 17 G Needle length: 9 cm and 9 Needle insertion depth: 7 cm Catheter type: closed end flexible Catheter size: 19 Gauge Catheter at skin depth: 13 cm Test dose: negative  Assessment Events: blood not aspirated, no cerebrospinal fluid, injection not painful, no injection resistance, no paresthesia and negative IV test

## 2023-04-25 DIAGNOSIS — O139 Gestational [pregnancy-induced] hypertension without significant proteinuria, unspecified trimester: Secondary | ICD-10-CM | POA: Diagnosis present

## 2023-04-25 LAB — CBC
HCT: 32 % — ABNORMAL LOW (ref 36.0–46.0)
Hemoglobin: 10.8 g/dL — ABNORMAL LOW (ref 12.0–15.0)
MCH: 27.3 pg (ref 26.0–34.0)
MCHC: 33.8 g/dL (ref 30.0–36.0)
MCV: 81 fL (ref 80.0–100.0)
Platelets: 278 10*3/uL (ref 150–400)
RBC: 3.95 MIL/uL (ref 3.87–5.11)
RDW: 13.2 % (ref 11.5–15.5)
WBC: 16.5 10*3/uL — ABNORMAL HIGH (ref 4.0–10.5)
nRBC: 0 % (ref 0.0–0.2)

## 2023-04-25 NOTE — Lactation Note (Signed)
This note was copied from a baby's chart. Lactation Consultation Note  Patient Name: Dawn Mullins [id number] Date: 04/25/2023 Age:34 hours Reason for consult: Follow-up assessment;Early term 37-38.6wks;Infant weight loss (- 8.15% WL)  Visited with family of 26 hours old ETI female; Dawn Mullins is a P3 and experienced breastfeeding. She reports baby is latching on consistently but falling asleep at the breast and having feedings of 15 minutes or less. Explained ETI and first 24 hours behavior, baby asleep when entered the room, Dawn Mullins voiced she feels comfortable practicing independent breastfeeding when this [facility] asked if she needed latch assistance. Reviewed feeding cues, cluster feeding, size of baby's stomach, pumping schedule, pump settings and anticipatory guidelines.  Feeding Mother's Current Feeding Choice: Breast Milk  Lactation Tools Discussed/Used Tools: Pump;Flanges Flange Size: 21 Breast pump type: Double-Electric Breast Pump Pump Education: Setup, frequency, and cleaning;Milk Storage Reason for Pumping: Infant weight loss of 8.15 %, ETI Pumping frequency: q 3 hours (recommended)  Interventions Interventions: Breast feeding basics reviewed;DEBP;Education  Plan Encouraged to put baby to breast +8 times/24 hours or sooner if feeding cues are present She'll start pumping every 3 hours (after feedings at the breast) in case. Parents will offer any amount of EBM she might get via spoon or bottle  FOB present and supportive. All questions and concerns answered, family to contact [facility] services PRN.  Discharge Pump: Personal;Hands Free (Mom Cozy)  Consult Status Consult Status: Follow-up Date: 04/26/23 Follow-up type: In-patient   Dawn Mullins 04/25/2023, 2:25 PM

## 2023-04-25 NOTE — Progress Notes (Signed)
   PPD #1 S/P NSVD  Live born female  Birth Weight: 6 lb 14.4 oz (3130 g) APGAR: 9, 9  Newborn Delivery   Birth date/time: 04/24/2023 16:25:00 Delivery type: Vaginal, Spontaneous    Baby name: Rulon Eisenmenger  Delivering provider: Olivia Mackie  Lacerations: 2nd degree;Vaginal  Circumcision: Completed, Dr. Billy Coast  Feeding: breast  Pain control at delivery: Epidural  S:  Patient asleep. Husband reports patient is feeling well.             Tolerating PO/No nausea or vomiting             Bleeding is light             Pain controlled with acetaminophen and ibuprofen (OTC)             Up ad lib/ambulatory/voiding without difficulties   O:  A & O x 3, in no apparent distress  Vitals:   04/24/23 1945 04/24/23 2329 04/25/23 0500 04/25/23 1007  BP: 121/78 110/65 (!) 102/59 115/68  Pulse: 90 71 72 76  Resp: 20 20 18 18   Temp:  97.7 F (36.5 C)  (!) 97.5 F (36.4 C)  TempSrc:    Oral  SpO2: 99% 100% 100% 99%  Weight:      Height:       Recent Labs    04/24/23 0614 04/25/23 0422  WBC 11.8* 16.5*  HGB 11.7* 10.8*  HCT 35.1* 32.0*  PLT 288 278    Blood type: --/--/B POS (10/15 1610)  Rubella: Nonimmune (04/11 0000)   I&O: I/O last 3 completed shifts: In: -  Out: 857 [Urine:800; Blood:57]          No intake/output data recorded.  Gen: AAO x 3, NAD Abdomen: soft, non-tender, non-distended Fundus: firm, non-tender, U-1 Perineum: repair intact Lochia: small Extremities: no edema, no calf pain or tenderness   A/P:  PPD # 1 32 y.o., [id number]  Principal Problem:   Postpartum care following vaginal delivery 10/15  Doing well - stable status  Routine post partum orders Active Problems:   Encounter for induction of labor   Second degree perineal laceration  Discussed perineal care and comfort measures.    SVD (spontaneous vaginal delivery)   Gestational hypertension  BPs WNL  Plan 1 week F/U in the office  Anticipate discharge tomorrow.   June Leap, MSN,  CNM 04/25/2023, 10:50 AM

## 2023-04-26 LAB — GLUCOSE, CAPILLARY: Glucose-Capillary: 69 mg/dL — ABNORMAL LOW (ref 70–99)

## 2023-04-26 MED ORDER — IBUPROFEN 600 MG PO TABS: 600.0000 mg | ORAL_TABLET | Freq: Four times a day (QID) | ORAL | 0 refills | Status: AC

## 2023-04-26 NOTE — Lactation Note (Signed)
This note was copied from a baby's chart. Lactation Consultation Note  Patient Name: Dawn Mullins [id number] Date: 04/26/2023 Age:34 hours  Reason for consult: Follow-up assessment;Early term 37-38.6wks;Infant weight loss  P3, [redacted]w[redacted]d, 12% weight loss (re-weight at 1619 was 11% weight loss)  Mother reports baby will nuzzle at breast but not sustaining latch. She feels it is related to the size of his mouth and her breast. Mother has a history of low milk supply with her other two children, including when she exclusively pumped and bottle fed.  Mother's feeding plan based on her lactation history is to pump and give her expressed milk and supplement with formula. She plans to continue with skin to skin and attempting to latch baby. Mother is pleased that her milk volume has increased. She recently pumped 12 mL. Encouraged to pump 8 times in 24 hrs to establish and maintain her milk supply.  Mother has a hand's free pump. [facility] referral sent requesting for mother to get a hospital grade DEBP due to her history of low milk supply and infant weight loss.   Mom made aware of O/P services, breastfeeding support groups, community resources, and our phone # for post-discharge questions.     Feeding Mother's Current Feeding Choice: Breast Milk and Formula Nipple Type: Slow - flow   Lactation Tools Discussed/Used Reason for Pumping: weight loss, stimulate milk production Pumping frequency: q 3 hours Pumped volume: 12 mL  Interventions Interventions: Education;LC Services brochure  Discharge Discharge Education: Engorgement and breast care;Warning signs for feeding baby Pump: Hands Free [facility] Program: Yes  Consult Status Consult Status: Complete (will follow if baby remains a patient) Date: 04/26/23    Dawn Mullins 04/26/2023, 4:54 PM

## 2023-04-26 NOTE — Progress Notes (Signed)
   PPD # 2 S/P NSVD  Live born female  Birth Weight: 6 lb 14.4 oz (3130 g) APGAR: 9, 9  Newborn Delivery   Birth date/time: 04/24/2023 16:25:00 Delivery type: Vaginal, Spontaneous    Baby name: Rulon Eisenmenger  Delivering provider: Olivia Mackie  Lacerations: 2nd degree;Vaginal  Circumcision: Completed, Dr. Billy Coast  Feeding: breast  Pain control at delivery: Epidural  S:  Patient asleep. Husband reports patient is feeling well.             Tolerating PO/No nausea or vomiting             Bleeding is light             Pain controlled with acetaminophen and ibuprofen (OTC)             Up ad lib/ambulatory/voiding without difficulties   O:  A & O x 3, in no apparent distress  Vitals:   04/25/23 0500 04/25/23 1007 04/25/23 1449 04/25/23 2032  BP: (!) 102/59 115/68 (!) 110/52 107/64  Pulse: 72 76 72 70  Resp: 18 18 18 18   Temp:  (!) 97.5 F (36.4 C) 98.3 F (36.8 C) 98 F (36.7 C)  TempSrc:  Oral Oral Oral  SpO2: 100% 99% 100% 99%  Weight:      Height:       Recent Labs    04/24/23 0614 04/25/23 0422  WBC 11.8* 16.5*  HGB 11.7* 10.8*  HCT 35.1* 32.0*  PLT 288 278    Blood type: --/--/B POS (10/15 2952)  Rubella: Nonimmune (04/11 0000)    Gen: AAO x 3, NAD Abdomen: soft, non-tender, non-distended Fundus: firm, non-tender, U-2 Perineum: repair intact Lochia: small Extremities: no edema, no calf pain or tenderness   A/P:  PPD # 2 32 y.o., [id number]  Principal Problem:   Postpartum care following vaginal delivery 10/15  Doing well - stable status  Routine post partum orders Active Problems:   Encounter for induction of labor   Second degree perineal laceration  Discussed perineal care and comfort measures.    SVD (spontaneous vaginal delivery)   Gestational hypertension  BPs WNL  Plan 1 week F/U in the office Discharge home today PP care dw pt    Robley Fries, MD 04/26/2023,

## 2023-04-26 NOTE — Discharge Summary (Addendum)
Postpartum Discharge Summary   Patient Name: Dawn Mullins DOB: Nov 07, 1990 MRN: [id number]  Date of admission: 04/24/2023 Delivery date:04/24/2023 Delivering provider: Olivia Mackie Date of discharge: 04/26/2023  Admitting diagnosis: Encounter for induction of labor [Z34.90] Intrauterine pregnancy: [redacted]w[redacted]d     Secondary diagnosis:  Principal Problem:   Postpartum care following vaginal delivery 10/15 Active Problems:   Encounter for induction of labor   Second degree perineal laceration   SVD (spontaneous vaginal delivery)   Gestational hypertension     Discharge diagnosis: Term Pregnancy Delivered   Gestational Hypertension well controlled                                         Post partum procedures: none Augmentation: AROM and Pitocin Complications: None  Hospital course: Induction of Labor With Vaginal Delivery   32 y.o. yo [id number] at [redacted]w[redacted]d was admitted to the hospital 04/24/2023 for induction of labor.  Indication for induction: Gestational hypertension.  Patient had an labor course complicated by none Membrane Rupture Time/Date: 8:19 AM,04/24/2023  Delivery Method:Vaginal, Spontaneous Operative Delivery:N/A Episiotomy: None Lacerations:  2nd degree;Vaginal Details of delivery can be found in separate delivery note.  Patient had a postpartum course complicated by none. Patient is discharged home 04/26/23.  Newborn Data: Birth date:04/24/2023 Birth time:4:25 PM Gender:Female Living status:Living Apgars:9 ,9  Weight:3130 g  Magnesium Sulfate received:no BMZ received: No Rhophylac:N/A MMR: Rubella Nonimmune. Needs MMR before discharge  T-DaP: no Flu: No RSV Vaccine received: No Transfusion:No Immunizations administered: There is no immunization history for the selected administration types on file for this patient.  Physical exam  Vitals:   04/25/23 0500 04/25/23 1007 04/25/23 1449 04/25/23 2032  BP: (!) 102/59 115/68 (!) 110/52 107/64  Pulse:  72 76 72 70  Resp: 18 18 18 18   Temp:  (!) 97.5 F (36.4 C) 98.3 F (36.8 C) 98 F (36.7 C)  TempSrc:  Oral Oral Oral  SpO2: 100% 99% 100% 99%  Weight:      Height:       General: alert, cooperative, and no distress Lochia: appropriate Uterine Fundus: firm Incision: No significant erythema DVT Evaluation: No evidence of DVT seen on physical exam. Labs: Lab Results  Component Value Date   WBC 16.5 (H) 04/25/2023   HGB 10.8 (L) 04/25/2023   HCT 32.0 (L) 04/25/2023   MCV 81.0 04/25/2023   PLT 278 04/25/2023      Latest Ref Rng & Units 04/24/2023    6:14 AM  CMP  Glucose 70 - 99 mg/dL 81   BUN 6 - 20 mg/dL 7   Creatinine 1.47 - 8.29 mg/dL 5.62   Sodium 130 - 865 mmol/L 137   Potassium 3.5 - 5.1 mmol/L 3.6   Chloride 98 - 111 mmol/L 106   CO2 22 - 32 mmol/L 19   Calcium 8.9 - 10.3 mg/dL 9.8   Total Protein 6.5 - 8.1 g/dL 6.3   Total Bilirubin 0.3 - 1.2 mg/dL 0.7   Alkaline Phos 38 - 126 U/L 82   AST 15 - 41 U/L 14   ALT 0 - 44 U/L 9    Edinburgh Score:    04/24/2023    6:18 PM  Edinburgh Postnatal Depression Scale Screening Tool  I have been able to laugh and see the funny side of things. 0  I have looked forward with enjoyment to things. 0  I have blamed myself unnecessarily when things went wrong. 0  I have been anxious or worried for no good reason. 0  I have felt scared or panicky for no good reason. 0  Things have been getting on top of me. 0  I have been so unhappy that I have had difficulty sleeping. 0  I have felt sad or miserable. 0  I have been so unhappy that I have been crying. 0  The thought of harming myself has occurred to me. 0  Edinburgh Postnatal Depression Scale Total 0      After visit meds:  Allergies as of 04/26/2023   No Known Allergies      Medication List     STOP taking these medications    benzocaine-Menthol 20-0.5 % Aero Commonly known as: DERMOPLAST   coconut oil Oil       TAKE these medications     acetaminophen 325 MG tablet Commonly known as: Tylenol Take 2 tablets (650 mg total) by mouth every 4 (four) hours as needed (for pain scale < 4).   ibuprofen 600 MG tablet Commonly known as: ADVIL Take 1 tablet (600 mg total) by mouth every 6 (six) hours.   prenatal multivitamin Tabs tablet Take 1 tablet by mouth daily at 12 noon.         Discharge home in stable condition Infant Feeding: Bottle and Breast Infant Disposition:home with mother Discharge instruction: per After Visit Summary and Postpartum booklet. Activity: Advance as tolerated. Pelvic rest for 6 weeks.  Diet: routine diet Anticipated Birth Control: Unsure Postpartum Appointment:1 week, 6 wks Additional Postpartum F/U: BP check 1 week Future Appointments:No future appointments. Follow up Visit:  Follow-up Information     Olivia Mackie, MD Follow up in 6 day(s).   Specialty: Obstetrics and Gynecology Why: f/up in 6 days for BP check and f/up in 6 weeks for complete postpartum visit with Dr Lowella Grip information: [address] [place] [address] [phone]                     04/26/2023 Robley Fries, MD

## 2023-04-26 NOTE — Discharge Instructions (Signed)
WHAT TO LOOK OUT FOR: Fever of 100.4 or above Mastitis: feels like flu and breasts hurt Infection: increased pain, swelling or redness Blood clots golf ball size or larger Postpartum depression   Congratulations on your newest addition!

## 2023-05-17 ENCOUNTER — Telehealth (HOSPITAL_COMMUNITY): Payer: Self-pay | Admitting: *Deleted

## 2023-05-17 NOTE — Telephone Encounter (Signed)
05/17/2023  Name: Cicely Ortner MRN: [id number] DOB: 11-01-1990  Reason for Call:  Transition of Care Hospital Discharge Call  Contact Status: Patient Contact Status: Message  Language assistant needed:          Follow-Up Questions:    Inocente Salles Postnatal Depression Scale:  In the Past 7 Days:    PHQ2-9 Depression Scale:     Discharge Follow-up:    Post-discharge interventions: NA  Damiyah Ditmars,RN  05/17/2023 1011
